# Patient Record
Sex: Male | Born: 1960 | Hispanic: Yes | Marital: Married | Smoking: Never smoker
Health system: Southern US, Community
[De-identification: ages and names within clinical notes are randomized; demographics above are authoritative.]

## PROBLEM LIST (undated history)

## (undated) DIAGNOSIS — F319 Bipolar disorder, unspecified: Secondary | ICD-10-CM

## (undated) DIAGNOSIS — I10 Essential (primary) hypertension: Secondary | ICD-10-CM

## (undated) DIAGNOSIS — E7800 Pure hypercholesterolemia, unspecified: Secondary | ICD-10-CM

## (undated) HISTORY — PX: HERNIA REPAIR: SHX51

---

## 2016-12-18 ENCOUNTER — Emergency Department: Payer: BLUE CROSS/BLUE SHIELD

## 2016-12-18 ENCOUNTER — Emergency Department
Admission: EM | Admit: 2016-12-18 | Discharge: 2016-12-18 | Disposition: A | Discharge status: Home / Self Care | Payer: BLUE CROSS/BLUE SHIELD | Attending: Emergency Medicine | Admitting: Emergency Medicine

## 2016-12-18 ENCOUNTER — Encounter: Payer: Self-pay | Admitting: Emergency Medicine

## 2016-12-18 DIAGNOSIS — S76219A Strain of adductor muscle, fascia and tendon of unspecified thigh, initial encounter: Secondary | ICD-10-CM | POA: Diagnosis not present

## 2016-12-18 DIAGNOSIS — Y9289 Other specified places as the place of occurrence of the external cause: Secondary | ICD-10-CM | POA: Insufficient documentation

## 2016-12-18 DIAGNOSIS — Y990 Civilian activity done for income or pay: Secondary | ICD-10-CM | POA: Insufficient documentation

## 2016-12-18 DIAGNOSIS — S76212A Strain of adductor muscle, fascia and tendon of left thigh, initial encounter: Secondary | ICD-10-CM

## 2016-12-18 DIAGNOSIS — W010XXA Fall on same level from slipping, tripping and stumbling without subsequent striking against object, initial encounter: Secondary | ICD-10-CM | POA: Diagnosis not present

## 2016-12-18 DIAGNOSIS — Y9301 Activity, walking, marching and hiking: Secondary | ICD-10-CM | POA: Insufficient documentation

## 2016-12-18 DIAGNOSIS — S3994XA Unspecified injury of external genitals, initial encounter: Secondary | ICD-10-CM | POA: Diagnosis present

## 2016-12-18 DIAGNOSIS — N50812 Left testicular pain: Secondary | ICD-10-CM

## 2016-12-18 MED ORDER — HYDROCODONE-ACETAMINOPHEN 5-325 MG PO TABS
2.0000 | ORAL_TABLET | Freq: Once | ORAL | Status: AC
  Administered 2016-12-18: 2 via ORAL
  Filled 2016-12-18: qty 2

## 2016-12-18 MED ORDER — HYDROCODONE-ACETAMINOPHEN 5-325 MG PO TABS: 1.0000 | ORAL_TABLET | Freq: Four times a day (QID) | ORAL | 0 refills | Status: DC | PRN

## 2016-12-18 MED ORDER — IBUPROFEN 400 MG PO TABS
ORAL_TABLET | ORAL | Status: AC
  Filled 2016-12-18: qty 1

## 2016-12-18 MED ORDER — IBUPROFEN 600 MG PO TABS: 600.0000 mg | ORAL_TABLET | Freq: Three times a day (TID) | ORAL | 0 refills | Status: DC | PRN

## 2016-12-18 MED ORDER — IBUPROFEN 400 MG PO TABS
600.0000 mg | ORAL_TABLET | Freq: Once | ORAL | Status: AC
Start: 2016-12-18 — End: 2016-12-18
  Administered 2016-12-18: 600 mg via ORAL

## 2016-12-18 NOTE — ED Triage Notes (Signed)
Pt to triage via WC, report slipped and fell at work, almost doing the splits, report pain to left testicle.  Pt reports area swollen as well and very tender to touch.  Pt in NAD at this time.   EDT attempt to contact supervisor and unable to reach.  Per WC profile, UDS only upon request.  Pt states he does not have alternative number at this time.

## 2016-12-18 NOTE — ED Notes (Signed)
Pt w/c profile states urine drug screening & Breath analysis "Only upon request" this tech attempts to contact Simeon CraftPaul Hall whose name is on the w/c profile but was unable to get anyone on the phone. This tech did not leave a message due to HIPPA violations. W/c not completed.

## 2016-12-18 NOTE — ED Provider Notes (Signed)
[facility] Emergency Department Provider Note  ____________________________________________   First MD Initiated Contact with Patient 12/18/16 2018     (approximate)  I have reviewed the triage vital signs and the nursing notes.   HISTORY  Chief Complaint Testicle Pain   HPI Andrew Mcclure is a 56 y.o. male who self presents to the emergency department with left groin pain that began suddenly at work. He was walking along when he slipped and did the splits and noted immediate severe searing tearing pain under his left testicle and along his left groin. It is worse when ambulating and improved with rest. He has no penile pain. No discharge. No fevers or chills. No abdominal pain nausea or vomiting.   History reviewed. No pertinent past medical history.  There are no active problems to display for this patient.   History reviewed. No pertinent surgical history.  Prior to Admission medications   Medication Sig Start Date End Date Taking? Authorizing Provider  HYDROcodone-acetaminophen (NORCO) 5-325 MG tablet Take 1 tablet by mouth every 6 (six) hours as needed for severe pain. 12/18/16   Merrily Brittle, MD  ibuprofen (ADVIL,MOTRIN) 600 MG tablet Take 1 tablet (600 mg total) by mouth every 8 (eight) hours as needed. 12/18/16   Merrily Brittle, MD    Allergies Patient has no known allergies.  History reviewed. No pertinent family history.  Social History Social History  Substance Use Topics  . Smoking status: Never Smoker  . Smokeless tobacco: Never Used  . Alcohol use No    Review of Systems Constitutional: No fever/chills ENT: No sore throat. Cardiovascular: Denies chest pain. Respiratory: Denies shortness of breath. Gastrointestinal: No abdominal pain.  No nausea, no vomiting.  No diarrhea.  No constipation. Musculoskeletal: Negative for back pain. Neurological: Negative for  headaches   ____________________________________________   PHYSICAL EXAM:  VITAL SIGNS: ED Triage Vitals [12/18/16 1913]  Enc Vitals Group     BP (!) 129/95     Pulse Rate 61     Resp 18     Temp 98.3 F (36.8 C)     Temp Source Oral     SpO2 100 %     Weight 198 lb (89.8 kg)     Height 5\' 9"  (1.753 m)     Head Circumference      Peak Flow      Pain Score 7     Pain Loc      Pain Edu?      Excl. in GC?     Constitutional: Alert and oriented 4 well appearing nontoxic no diaphoresis Head: Atraumatic. Nose: No congestion/rhinnorhea. Mouth/Throat: No trismus Neck: No stridor.   Cardiovascular: Regular rate and rhythm no murmurs Respiratory: Normal respiratory effort.  No retractions. Gastrointestinal: Soft nontender Genitourinary exam: Normal phallus, somewhat swollen left side of his scrotum although with no testicular tenderness he is tender underneath his scrotum along the perineum along the left side Neurologic:  Normal speech and language. No gross focal neurologic deficits are appreciated.  Skin:  Skin is warm, dry and intact. No rash noted.    ____________________________________________  LABS (all labs ordered are listed, but only abnormal results are displayed)  Labs Reviewed - No data to display   __________________________________________  EKG   ____________________________________________  RADIOLOGY  Testicular ultrasound without acute disease ____________________________________________   PROCEDURES  Procedure(s) performed: no  Procedures  Critical Care performed: no  Observation: no ____________________________________________   INITIAL IMPRESSION / ASSESSMENT AND PLAN / ED  COURSE  Pertinent labs & imaging results that were available during my care of the patient were reviewed by me and considered in my medical decision making (see chart for details).  The patient has acute severe left groin pain after performing the splits  inadvertently. Fortunately his testicular ultrasound is negative today and shows no signs of testicular torsion or trauma. This most likely represents a groin strain of his abductor muscles. I've advised him to ice the region as well as to use NSAIDs with Norco for breakthrough pain. It is not required that he follow up with orthopedic surgeon but I will provide him with the information for orthopedic surgeon on call for follow-up in one week should he desire. Strict return precautions given.      ____________________________________________   FINAL CLINICAL IMPRESSION(S) / ED DIAGNOSES  Final diagnoses:  Groin strain, left, initial encounter      NEW MEDICATIONS STARTED DURING THIS VISIT:  Discharge Medication List as of 12/18/2016  8:57 PM    START taking these medications   Details  HYDROcodone-acetaminophen (NORCO) 5-325 MG tablet Take 1 tablet by mouth every 6 (six) hours as needed for severe pain., Starting Thu 12/18/2016, Print    ibuprofen (ADVIL,MOTRIN) 600 MG tablet Take 1 tablet (600 mg total) by mouth every 8 (eight) hours as needed., Starting Thu 12/18/2016, Print         Note:  This document was prepared using Dragon voice recognition software and may include unintentional dictation errors.      Merrily Brittleifenbark, Olsen Mccutchan, MD 12/19/16 2226

## 2016-12-18 NOTE — Discharge Instructions (Signed)
Fortunately today the ultrasound of your testicles was normal in no damage to testicles occurred. You did strain a muscle which is extremely painful. Expect for your discomfort to last a full 2 weeks or so. Please follow-up with Dr. Joice LoftsPoggi of orthopedic surgery in one week should her pain persist and if he would like a reevaluation. Otherwise take your pain medication as prescribed and follow up with your primary care physician as needed.  It was a pleasure to take care of you today, and thank you for coming to our emergency department.  If you have any questions or concerns before leaving please ask the nurse to grab me and I'm more than happy to go through your aftercare instructions again.  If you were prescribed any opioid pain medication today such as Norco, Vicodin, Percocet, morphine, hydrocodone, or oxycodone please make sure you do not drive when you are taking this medication as it can alter your ability to drive safely.  If you have any concerns once you are home that you are not improving or are in fact getting worse before you can make it to your follow-up appointment, please do not hesitate to call 911 and come back for further evaluation.  Merrily BrittleNeil Mikeisha Lemonds MD  No results found for this or any previous visit. [place] Scrotum  Result Date: 12/18/2016 CLINICAL DATA:  Status post fall yesterday with pain and swelling of the left testicle EXAM: SCROTAL ULTRASOUND DOPPLER ULTRASOUND OF THE TESTICLES TECHNIQUE: Complete ultrasound examination of the testicles, epididymis, and other scrotal structures was performed. Color and spectral Doppler ultrasound were also utilized to evaluate blood flow to the testicles. COMPARISON:  None. FINDINGS: Right testicle Measurements: 4.4 x 2.5 x 3.2 cm. No mass or microlithiasis visualized. Left testicle Measurements: 3.9 x 2.2 x 3 cm. No mass or microlithiasis visualized. Right epididymis:  Normal in size and appearance. Left epididymis:  Normal in size and  appearance. Hydrocele:  There are small bilateral hydroceles. Varicocele:  None visualized. Pulsed Doppler interrogation of both testes demonstrates normal low resistance arterial and venous waveforms bilaterally. IMPRESSION: Small bilateral hydroceles.  No evidence of testicular torsion. Electronically Signed   By: Sherian ReinWei-Chen  Lin M.D.   On: 12/18/2016 20:30   [place] Art/ven Flow Abd Pelv Doppler  Result Date: 12/18/2016 CLINICAL DATA:  Status post fall yesterday with pain and swelling of the left testicle EXAM: SCROTAL ULTRASOUND DOPPLER ULTRASOUND OF THE TESTICLES TECHNIQUE: Complete ultrasound examination of the testicles, epididymis, and other scrotal structures was performed. Color and spectral Doppler ultrasound were also utilized to evaluate blood flow to the testicles. COMPARISON:  None. FINDINGS: Right testicle Measurements: 4.4 x 2.5 x 3.2 cm. No mass or microlithiasis visualized. Left testicle Measurements: 3.9 x 2.2 x 3 cm. No mass or microlithiasis visualized. Right epididymis:  Normal in size and appearance. Left epididymis:  Normal in size and appearance. Hydrocele:  There are small bilateral hydroceles. Varicocele:  None visualized. Pulsed Doppler interrogation of both testes demonstrates normal low resistance arterial and venous waveforms bilaterally. IMPRESSION: Small bilateral hydroceles.  No evidence of testicular torsion. Electronically Signed   By: Sherian ReinWei-Chen  Lin M.D.   On: 12/18/2016 20:30

## 2016-12-18 NOTE — ED Notes (Signed)
Pt to u/s via w/c accomp by u/s tech 

## 2016-12-18 NOTE — ED Notes (Signed)
Will defer physical exam to the MD.

## 2019-01-24 IMAGING — US US SCROTUM
1 series · 14 of 25 positions shown · non-contrast
Comparison: None.

CLINICAL DATA: Status post fall yesterday with pain and swelling of
the left testicle

EXAM:
SCROTAL ULTRASOUND
DOPPLER ULTRASOUND OF THE TESTICLES
TECHNIQUE: Complete ultrasound examination of the testicles, epididymis, and
other scrotal structures was performed. Color and spectral Doppler
ultrasound were also utilized to evaluate blood flow to the
testicles.

[Series 1: us scrotum · 0.08mm/px · 14 of 64 slices shown]
[im 1/64]
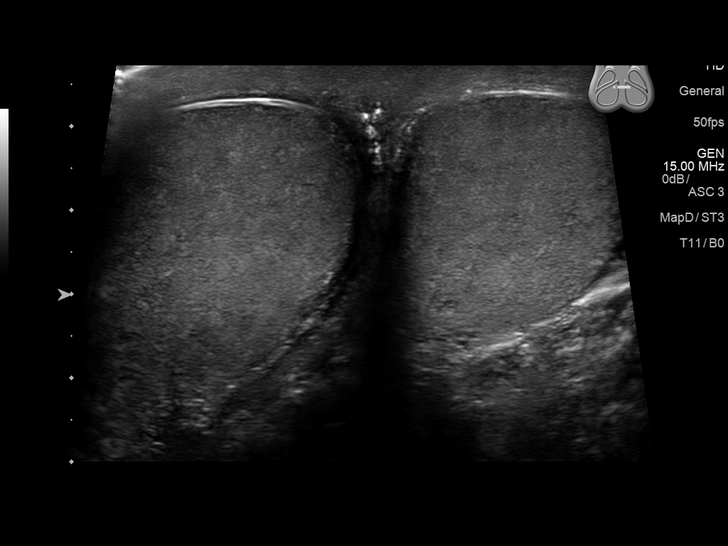
[im 6/64]
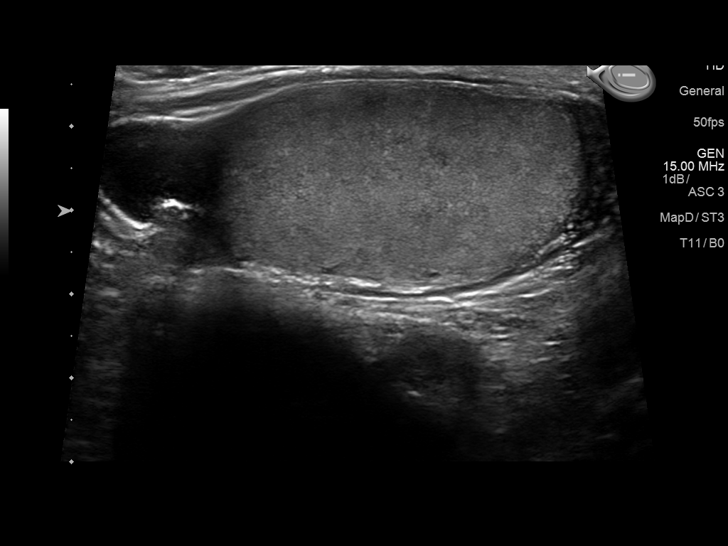
[im 11/64]
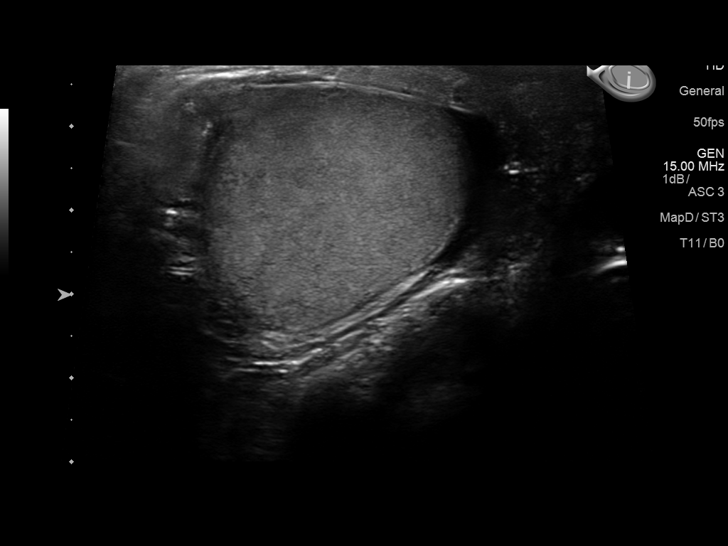
[im 16/64]
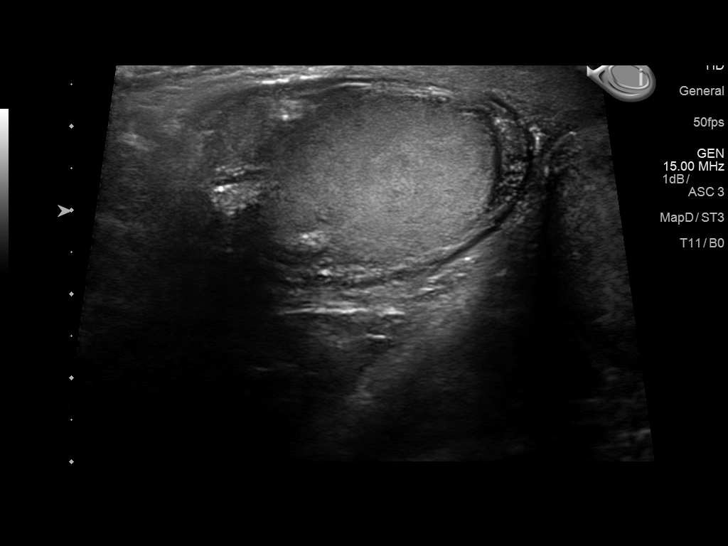
[im 22/64]
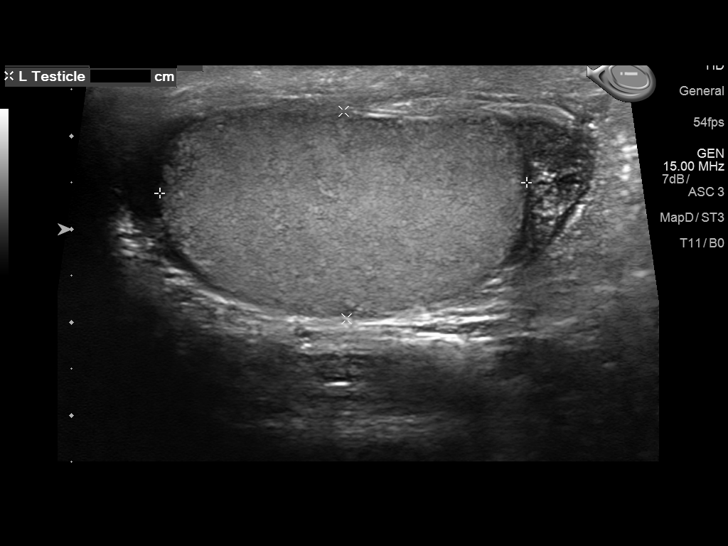
[im 24/64]
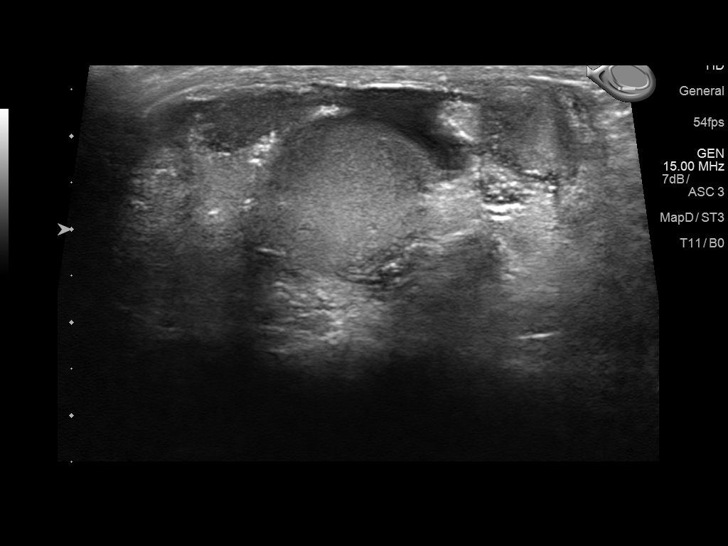
[im 29/64]
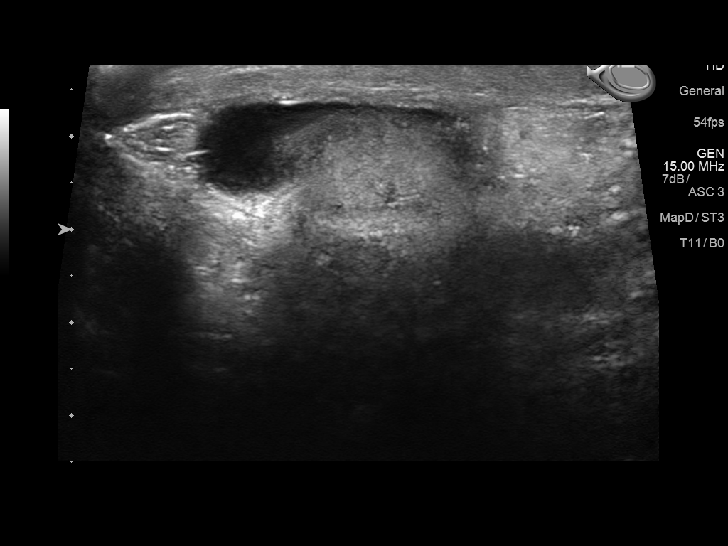
[im 35/64]
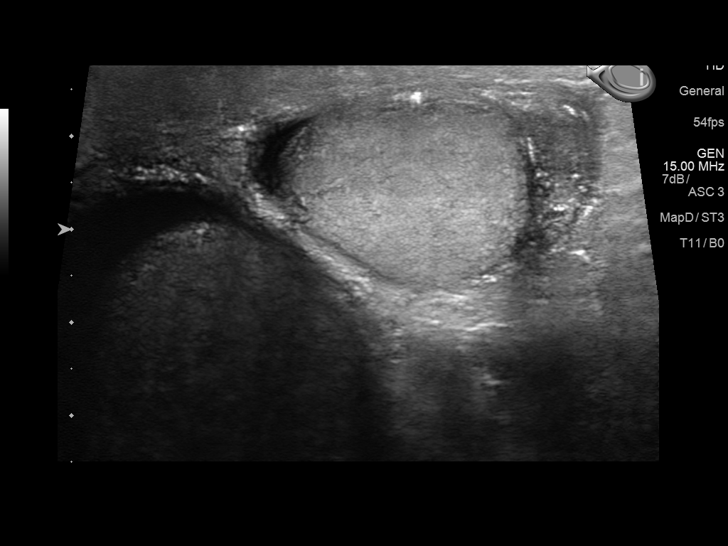
[im 40/64]
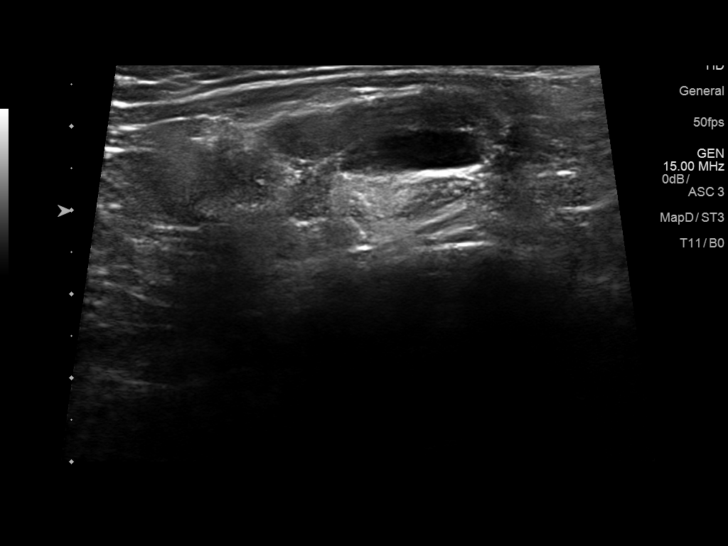
[im 43/64]
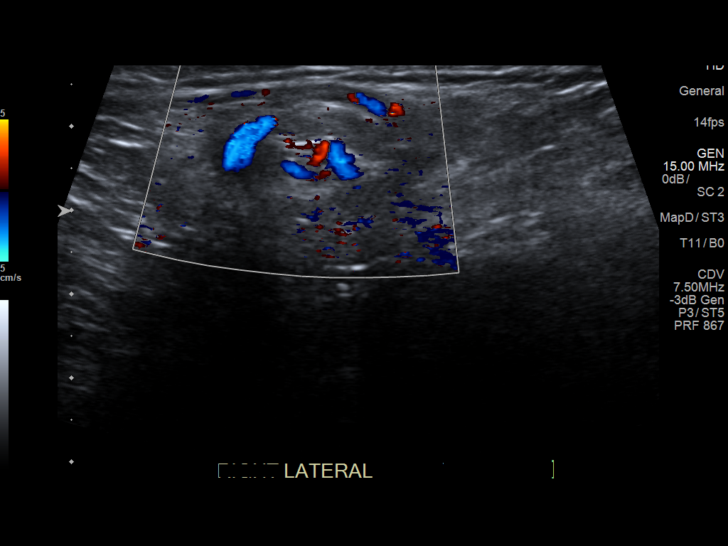
[im 48/64]
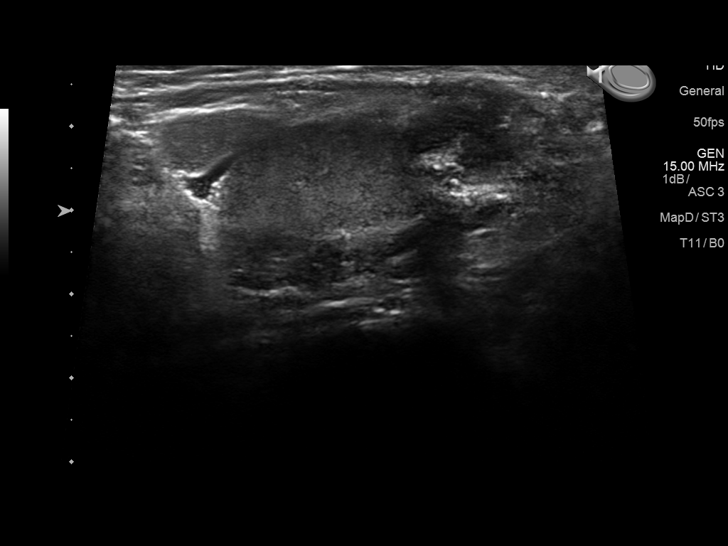
[im 53/64]
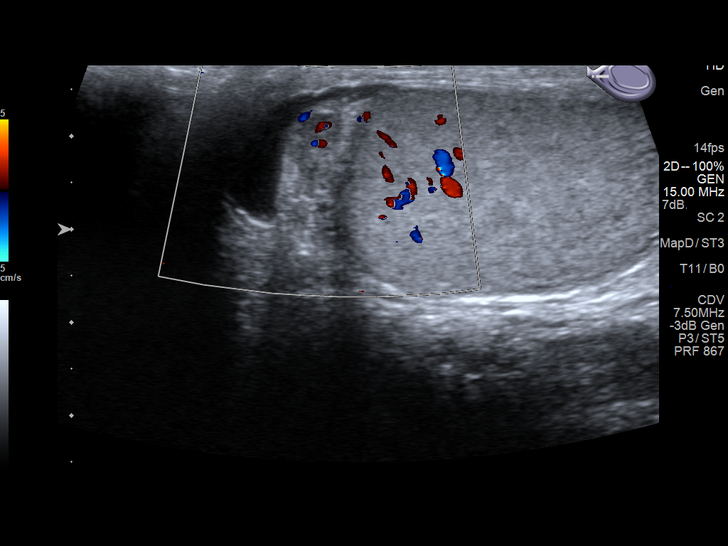
[im 58/64]
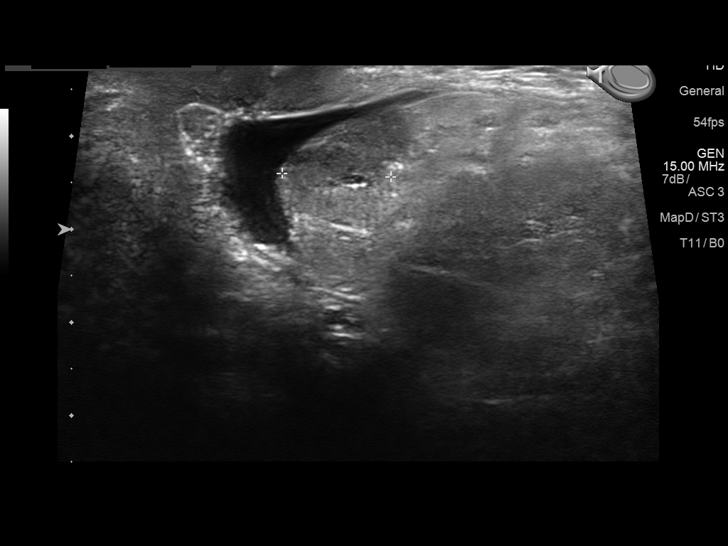
[im 64/64]
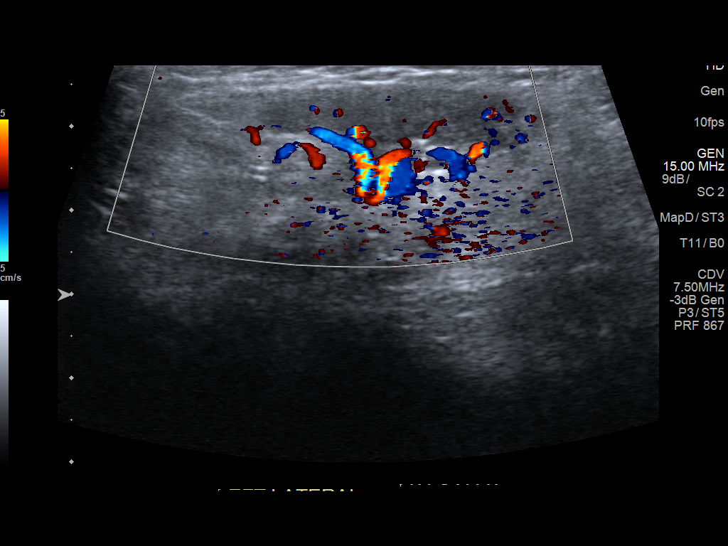

[14 of 25 positions shown; findings below may reference images not displayed]

FINDINGS: Right testicle

Measurements: 4.4 x 2.5 x 3.2 cm. No mass or microlithiasis
visualized.

Left testicle

Measurements: 3.9 x 2.2 x 3 cm. No mass or microlithiasis
visualized.

Right epididymis:  Normal in size and appearance.

Left epididymis:  Normal in size and appearance.

Hydrocele:  There are small bilateral hydroceles.

Varicocele:  None visualized.

Pulsed Doppler interrogation of both testes demonstrates normal low
resistance arterial and venous waveforms bilaterally.
IMPRESSION: Small bilateral hydroceles.  No evidence of testicular torsion.

## 2021-10-15 ENCOUNTER — Emergency Department: Payer: BLUE CROSS/BLUE SHIELD

## 2021-10-15 ENCOUNTER — Other Ambulatory Visit: Payer: Self-pay

## 2021-10-15 ENCOUNTER — Emergency Department
Admission: EM | Admit: 2021-10-15 | Discharge: 2021-10-15 | Disposition: A | Discharge status: Home / Self Care | Payer: BLUE CROSS/BLUE SHIELD | Attending: Emergency Medicine | Admitting: Emergency Medicine

## 2021-10-15 DIAGNOSIS — S60222A Contusion of left hand, initial encounter: Secondary | ICD-10-CM | POA: Insufficient documentation

## 2021-10-15 DIAGNOSIS — S6000XA Contusion of unspecified finger without damage to nail, initial encounter: Secondary | ICD-10-CM

## 2021-10-15 DIAGNOSIS — W230XXA Caught, crushed, jammed, or pinched between moving objects, initial encounter: Secondary | ICD-10-CM | POA: Insufficient documentation

## 2021-10-15 DIAGNOSIS — S6992XA Unspecified injury of left wrist, hand and finger(s), initial encounter: Secondary | ICD-10-CM | POA: Diagnosis present

## 2021-10-15 NOTE — ED Provider Notes (Signed)
? ?[facility] ?Provider Note ? ? ? Event Date/Time  ? First MD Initiated Contact with Patient 10/15/21 1516   ?  (approximate) ? ? ?History  ? ?Finger Injury ? ? ?HPI ? ?Andrew Mcclure is a 61 y.o. male presents emergency department complaining of left hand pain.  States his fingers got caught in the window while it was being rolled.  Patient complains of pain, no decreased range of motion, denies numbness or tingling.  Happened this morning ? ?  ? ? ?Physical Exam  ? ?Triage Vital Signs: ?ED Triage Vitals  ?Enc Vitals Group  ?   BP 10/15/21 1415 136/90  ?   Pulse Rate 10/15/21 1415 (!) 56  ?   Resp 10/15/21 1415 16  ?   Temp 10/15/21 1415 97.9 ?F (36.6 ?C)  ?   Temp Source 10/15/21 1415 Oral  ?   SpO2 10/15/21 1415 96 %  ?   Weight --   ?   Height --   ?   Head Circumference --   ?   Peak Flow --   ?   Pain Score 10/15/21 1356 6  ?   Pain Loc --   ?   Pain Edu? --   ?   Excl. in [place]? --   ? ? ?Most recent vital signs: ?Vitals:  ? 10/15/21 1415  ?BP: 136/90  ?Pulse: (!) 56  ?Resp: 16  ?Temp: 97.9 ?F (36.6 ?C)  ?SpO2: 96%  ? ? ? ?General: Awake, no distress.   ?CV:  Good peripheral perfusion. regular rate and  rhythm ?Resp:  Normal effort.  ?Abd:  No distention.   ?Other:  Full range of motion of all fingers, second third and fourth fingers tender to palpation, neurovascular intact ? ? ?ED Results / Procedures / Treatments  ? ?Labs ?(all labs ordered are listed, but only abnormal results are displayed) ?Labs Reviewed - No data to display ? ? ?EKG ? ? ? ? ?RADIOLOGY ?X-ray of the left hand ? ? ? ?PROCEDURES: ? ? ?Procedures ? ? ?MEDICATIONS ORDERED IN ED: ?Medications - No data to display ? ? ?IMPRESSION / MDM / ASSESSMENT AND PLAN / ED COURSE  ?I reviewed the triage vital signs and the nursing notes. ?             ?               ? ?Differential diagnosis includes, but is not limited to, contusion, fracture, laceration ? ?Patient's skin is intact he does not have a laceration. ? ?X-ray left hand  was independently reviewed by me and does not have any fractures.  Confirmed by radiology ? ?Explained the findings to the patient.  He is placed in buddy tape.  He is to wear this for 3 days.  Follow-up with his regular doctor if not improving in 3 to 4 days or he may follow-up with orthopedics.  He is in agreement treatment plan.  Discharged stable condition. ? ? ? ? ?  ? ? ?FINAL CLINICAL IMPRESSION(S) / ED DIAGNOSES  ? ?Final diagnoses:  ?Contusion of finger of left hand, unspecified finger, initial encounter  ? ? ? ?Rx / DC Orders  ? ?ED Discharge Orders   ? ? None  ? ?  ? ? ? ?Note:  This document was prepared using Dragon voice recognition software and may include unintentional dictation errors. ? ?  ?Versie Starks, PA-C ?10/15/21 1538 ? ?  ?Harvest Dark, MD ?10/15/21  2024 ? ?

## 2021-10-15 NOTE — ED Triage Notes (Signed)
Pt states he got his left 3rd and 4th finger rolled up in the car window today. ?

## 2021-10-15 NOTE — Discharge Instructions (Signed)
Wear the buddy tape for 3 days ?Tylenol or ibuprofen for pain as needed ?

## 2022-05-16 ENCOUNTER — Encounter: Payer: Self-pay | Admitting: Emergency Medicine

## 2022-05-16 ENCOUNTER — Other Ambulatory Visit: Payer: Self-pay

## 2022-05-16 ENCOUNTER — Emergency Department
Admission: EM | Admit: 2022-05-16 | Discharge: 2022-05-16 | Disposition: A | Discharge status: Home / Self Care | Payer: No Typology Code available for payment source | Attending: Student in an Organized Health Care Education/Training Program | Admitting: Student in an Organized Health Care Education/Training Program

## 2022-05-16 ENCOUNTER — Emergency Department: Payer: No Typology Code available for payment source

## 2022-05-16 DIAGNOSIS — U071 COVID-19: Secondary | ICD-10-CM

## 2022-05-16 DIAGNOSIS — R110 Nausea: Secondary | ICD-10-CM

## 2022-05-16 HISTORY — DX: Bipolar disorder, unspecified: F31.9

## 2022-05-16 LAB — CBC WITH DIFFERENTIAL/PLATELET
Abs Immature Granulocytes: 0.01 10*3/uL (ref 0.00–0.07)
Basophils Absolute: 0 10*3/uL (ref 0.0–0.1)
Basophils Relative: 0 %
Eosinophils Absolute: 0 10*3/uL (ref 0.0–0.5)
Eosinophils Relative: 1 %
HCT: 49.5 % (ref 39.0–52.0)
Hemoglobin: 17 g/dL (ref 13.0–17.0)
Immature Granulocytes: 0 %
Lymphocytes Relative: 21 %
Lymphs Abs: 1.3 10*3/uL (ref 0.7–4.0)
MCH: 32.6 pg (ref 26.0–34.0)
MCHC: 34.3 g/dL (ref 30.0–36.0)
MCV: 95 fL (ref 80.0–100.0)
Monocytes Absolute: 0.5 10*3/uL (ref 0.1–1.0)
Monocytes Relative: 8 %
Neutro Abs: 4.6 10*3/uL (ref 1.7–7.7)
Neutrophils Relative %: 70 %
Platelets: 218 10*3/uL (ref 150–400)
RBC: 5.21 MIL/uL (ref 4.22–5.81)
RDW: 13.1 % (ref 11.5–15.5)
WBC: 6.5 10*3/uL (ref 4.0–10.5)
nRBC: 0 % (ref 0.0–0.2)

## 2022-05-16 LAB — COMPREHENSIVE METABOLIC PANEL
ALT: 13 U/L (ref 0–44)
AST: 16 U/L (ref 15–41)
Albumin: 3.8 g/dL (ref 3.5–5.0)
Alkaline Phosphatase: 83 U/L (ref 38–126)
Anion gap: 10 (ref 5–15)
BUN: 13 mg/dL (ref 6–20)
CO2: 20 mmol/L — ABNORMAL LOW (ref 22–32)
Calcium: 9.3 mg/dL (ref 8.9–10.3)
Chloride: 108 mmol/L (ref 98–111)
Creatinine, Ser: 0.96 mg/dL (ref 0.61–1.24)
GFR, Estimated: >60 mL/min (ref >60)
Glucose, Bld: 118 mg/dL — ABNORMAL HIGH (ref 70–99)
Potassium: 3.3 mmol/L — ABNORMAL LOW (ref 3.5–5.1)
Sodium: 138 mmol/L (ref 135–145)
Total Bilirubin: 0.7 mg/dL (ref 0.3–1.2)
Total Protein: 7.7 g/dL (ref 6.5–8.1)

## 2022-05-16 LAB — TROPONIN I (HIGH SENSITIVITY)
Troponin I (High Sensitivity): 4 ng/L (ref <18)
Troponin I (High Sensitivity): 3 ng/L (ref <18)

## 2022-05-16 MED ORDER — ONDANSETRON 4 MG PO TBDP: 4.0000 mg | ORAL_TABLET | Freq: Three times a day (TID) | ORAL | 0 refills | Status: DC | PRN

## 2022-05-16 MED ORDER — ALBUTEROL SULFATE (2.5 MG/3ML) 0.083% IN NEBU
3.0000 mL | INHALATION_SOLUTION | Freq: Once | RESPIRATORY_TRACT | Status: AC
  Administered 2022-05-16: 3 mL via RESPIRATORY_TRACT
  Filled 2022-05-16: qty 3

## 2022-05-16 MED ORDER — POTASSIUM CHLORIDE CRYS ER 20 MEQ PO TBCR
40.0000 meq | EXTENDED_RELEASE_TABLET | Freq: Once | ORAL | Status: AC
  Administered 2022-05-16: 40 meq via ORAL
  Filled 2022-05-16: qty 2

## 2022-05-16 MED ORDER — ACETAMINOPHEN 500 MG PO TABS
1000.0000 mg | ORAL_TABLET | Freq: Once | ORAL | Status: AC
  Administered 2022-05-16: 1000 mg via ORAL
  Filled 2022-05-16: qty 2

## 2022-05-16 MED ORDER — SODIUM CHLORIDE 0.9 % IV BOLUS
1000.0000 mL | Freq: Once | INTRAVENOUS | Status: AC
  Administered 2022-05-16: 1000 mL via INTRAVENOUS

## 2022-05-16 MED ORDER — ONDANSETRON HCL 4 MG/2ML IJ SOLN
4.0000 mg | Freq: Once | INTRAMUSCULAR | Status: AC
  Administered 2022-05-16: 4 mg via INTRAVENOUS
  Filled 2022-05-16: qty 2

## 2022-05-16 NOTE — ED Triage Notes (Signed)
Pt in via POV, reports being Covid+ on Tuesday, being prescribed Paxlovid which he has been taking but continues to feel worse.  Complaints of ongoing headache, shortness of breath w/ new onset of chest pain as walking to room.  A/Ox4, NAD noted at this time.

## 2022-05-16 NOTE — ED Notes (Signed)
Pt o2 sat 98% on room air while ambulating.

## 2022-05-16 NOTE — ED Provider Notes (Signed)
[facility] Provider Note    Event Date/Time   First MD Initiated Contact with Patient 05/16/22 [phone]     (approximate)   History   Covid Positive   HPI  Andrew Mcclure is a 61 y.o. male previously healthy with recent diagnosis of COVID presents to the ER for evaluation of generalized malaise does not feel like he is getting better after 3 days of Paxlovid.  He is having shortness of breath cough and some chest discomfort.  No pleuritic chest pain.  Does feel more winded than usual with ambulation.  He was fully vaccinated.  Has had a few episodes of nausea with vomiting no diarrhea.  Does not feel nauseated at this time no abdominal pain.     Physical Exam   Triage Vital Signs: ED Triage Vitals  Enc Vitals Group     BP 05/16/22 0938 (!) 172/97     Pulse Rate 05/16/22 0938 (!) 50     Resp 05/16/22 0938 17     Temp 05/16/22 0938 99.5 F (37.5 C)     Temp Source 05/16/22 0938 Oral     SpO2 05/16/22 0938 97 %     Weight 05/16/22 0939 169 lb (76.7 kg)     Height 05/16/22 0939 5\' 9"  (1.753 m)     Head Circumference --      Peak Flow --      Pain Score 05/16/22 0938 5     Pain Loc --      Pain Edu? --      Excl. in GC? --     Most recent vital signs: Vitals:   05/16/22 0938 05/16/22 1030  BP: (!) 172/97 (!) 142/89  Pulse: (!) 50 (!) 51  Resp: 17 16  Temp: 99.5 F (37.5 C)   SpO2: 97% 98%     Constitutional: Alert  Eyes: Conjunctivae are normal.  Head: Atraumatic. Nose: No congestion/rhinnorhea. Mouth/Throat: Mucous membranes are moist.   Neck: Painless ROM.  Cardiovascular:   Good peripheral circulation. Respiratory: Normal respiratory effort.  No retractions.  Gastrointestinal: Soft and nontender.  Musculoskeletal:  no deformity Neurologic:  MAE spontaneously. No gross focal neurologic deficits are appreciated.  Skin:  Skin is warm, dry and intact. No rash noted. Psychiatric: Mood and affect are normal. Speech and behavior are  normal.    ED Results / Procedures / Treatments   Labs (all labs ordered are listed, but only abnormal results are displayed) Labs Reviewed  COMPREHENSIVE METABOLIC PANEL - Abnormal; Notable for the following components:      Result Value   Potassium 3.3 (*)    CO2 20 (*)    Glucose, Bld 118 (*)    All other components within normal limits  CBC WITH DIFFERENTIAL/PLATELET  TROPONIN I (HIGH SENSITIVITY)  TROPONIN I (HIGH SENSITIVITY)     EKG  ED ECG REPORT I, 05/18/22, the attending physician, personally viewed and interpreted this ECG.   Date: 05/16/2022  EKG Time: 10:00  Rate: 50  Rhythm: sinus  Axis: normal  Intervals: normal  ST&T Change: no stemi, no depressions    RADIOLOGY Please see ED Course for my review and interpretation.  I personally reviewed all radiographic images ordered to evaluate for the above acute complaints and reviewed radiology reports and findings.  These findings were personally discussed with the patient.  Please see medical record for radiology report.    PROCEDURES:  Critical Care performed: No  Procedures   MEDICATIONS ORDERED IN  ED: Medications  ondansetron (ZOFRAN) injection 4 mg (has no administration in time range)  acetaminophen (TYLENOL) tablet 1,000 mg (has no administration in time range)  sodium chloride 0.9 % bolus 1,000 mL (0 mLs Intravenous Stopped 05/16/22 1151)  albuterol (PROVENTIL) (2.5 MG/3ML) 0.083% nebulizer solution 3 mL (3 mLs Nebulization Given 05/16/22 1050)  potassium chloride SA (KLOR-CON M) CR tablet 40 mEq (40 mEq Oral Given 05/16/22 1050)     IMPRESSION / MDM / ASSESSMENT AND PLAN / ED COURSE  I reviewed the triage vital signs and the nursing notes.                              Differential diagnosis includes, but is not limited to, covid, pna, ptx, pe, chf, dehydration, medication effect  Patient presenting to the ER for evaluation of symptoms as described above.  Based on symptoms,  risk factors and considered above differential, this presenting complaint could reflect a potentially life-threatening illness therefore the patient will be placed on continuous pulse oximetry and telemetry for monitoring.  Laboratory evaluation will be sent to evaluate for the above complaints.      Clinical Course as of 05/16/22 1305  Fri May 16, 2022  1015 Chest x-ray on my review and interpretation does not show any evidence of consolidation or pneumothorax. [PR]  1302 Patient's blood work is reassuring.  Symptoms are consistent with COVID-19.  Have low suspicion for PE bacterial pneumonia CHF or other acute abnormality.  Patient tolerating p.o. is now complaining of some mild nausea but not vomiting.  This point do believe he stable and appropriate for outpatient follow-up.  Discussed strict return precautions. [PR]    Clinical Course User Index [PR] Willy Eddy, MD   FINAL CLINICAL IMPRESSION(S) / ED DIAGNOSES   Final diagnoses:  COVID-19  Nausea     Rx / DC Orders   ED Discharge Orders          Ordered    ondansetron (ZOFRAN-ODT) 4 MG disintegrating tablet  Every 8 hours PRN,   Status:  Discontinued        05/16/22 1302    ondansetron (ZOFRAN-ODT) 4 MG disintegrating tablet  Every 8 hours PRN        05/16/22 1302             Note:  This document was prepared using Dragon voice recognition software and may include unintentional dictation errors.    Willy Eddy, MD 05/16/22 [phone]

## 2022-05-17 ENCOUNTER — Emergency Department: Payer: No Typology Code available for payment source

## 2022-05-17 ENCOUNTER — Observation Stay
Admission: EM | Admit: 2022-05-17 | Discharge: 2022-05-19 | Disposition: A | Discharge status: Home / Self Care | Payer: No Typology Code available for payment source | Attending: Internal Medicine | Admitting: Internal Medicine

## 2022-05-17 ENCOUNTER — Other Ambulatory Visit: Payer: Self-pay

## 2022-05-17 DIAGNOSIS — U071 COVID-19: Secondary | ICD-10-CM | POA: Diagnosis not present

## 2022-05-17 DIAGNOSIS — R42 Dizziness and giddiness: Secondary | ICD-10-CM | POA: Diagnosis not present

## 2022-05-17 DIAGNOSIS — E876 Hypokalemia: Secondary | ICD-10-CM

## 2022-05-17 DIAGNOSIS — E860 Dehydration: Secondary | ICD-10-CM | POA: Diagnosis not present

## 2022-05-17 DIAGNOSIS — R9431 Abnormal electrocardiogram [ECG] [EKG]: Secondary | ICD-10-CM | POA: Diagnosis present

## 2022-05-17 DIAGNOSIS — R630 Anorexia: Secondary | ICD-10-CM | POA: Insufficient documentation

## 2022-05-17 DIAGNOSIS — R112 Nausea with vomiting, unspecified: Secondary | ICD-10-CM | POA: Diagnosis present

## 2022-05-17 DIAGNOSIS — F319 Bipolar disorder, unspecified: Secondary | ICD-10-CM | POA: Diagnosis present

## 2022-05-17 DIAGNOSIS — N400 Enlarged prostate without lower urinary tract symptoms: Secondary | ICD-10-CM | POA: Diagnosis present

## 2022-05-17 DIAGNOSIS — Z79899 Other long term (current) drug therapy: Secondary | ICD-10-CM | POA: Diagnosis not present

## 2022-05-17 DIAGNOSIS — R110 Nausea: Secondary | ICD-10-CM

## 2022-05-17 HISTORY — DX: Nausea with vomiting, unspecified: R11.2

## 2022-05-17 HISTORY — DX: Bipolar disorder, unspecified: F31.9

## 2022-05-17 LAB — CBC
HCT: 49.3 % (ref 39.0–52.0)
Hemoglobin: 16.5 g/dL (ref 13.0–17.0)
MCH: 32 pg (ref 26.0–34.0)
MCHC: 33.5 g/dL (ref 30.0–36.0)
MCV: 95.7 fL (ref 80.0–100.0)
Platelets: 228 10*3/uL (ref 150–400)
RBC: 5.15 MIL/uL (ref 4.22–5.81)
RDW: 13 % (ref 11.5–15.5)
WBC: 6.1 10*3/uL (ref 4.0–10.5)
nRBC: 0 % (ref 0.0–0.2)

## 2022-05-17 LAB — URINALYSIS, ROUTINE W REFLEX MICROSCOPIC
Bilirubin Urine: NEGATIVE
Glucose, UA: NEGATIVE mg/dL
Hgb urine dipstick: NEGATIVE
Ketones, ur: 5 mg/dL — AB
Leukocytes,Ua: NEGATIVE
Nitrite: NEGATIVE
Protein, ur: NEGATIVE mg/dL
Specific Gravity, Urine: 1.01 (ref 1.005–1.030)
pH: 7 (ref 5.0–8.0)

## 2022-05-17 LAB — BASIC METABOLIC PANEL
Anion gap: 9 (ref 5–15)
BUN: 13 mg/dL (ref 6–20)
CO2: 22 mmol/L (ref 22–32)
Calcium: 9.7 mg/dL (ref 8.9–10.3)
Chloride: 111 mmol/L (ref 98–111)
Creatinine, Ser: 0.98 mg/dL (ref 0.61–1.24)
GFR, Estimated: >60 mL/min (ref >60)
Glucose, Bld: 125 mg/dL — ABNORMAL HIGH (ref 70–99)
Potassium: 3.9 mmol/L (ref 3.5–5.1)
Sodium: 142 mmol/L (ref 135–145)

## 2022-05-17 LAB — C-REACTIVE PROTEIN: CRP: <0.5 mg/dL (ref <1.0)

## 2022-05-17 MED ORDER — PROMETHAZINE HCL 25 MG/ML IJ SOLN
12.5000 mg | Freq: Four times a day (QID) | INTRAMUSCULAR | Status: DC | PRN
  Administered 2022-05-17: 12.5 mg via INTRAMUSCULAR
  Filled 2022-05-17 (×2): qty 1

## 2022-05-17 MED ORDER — SALINE SPRAY 0.65 % NA SOLN
1.0000 | Freq: Once | NASAL | Status: AC
  Administered 2022-05-17: 1 via NASAL
  Filled 2022-05-17: qty 44

## 2022-05-17 MED ORDER — ENOXAPARIN SODIUM 40 MG/0.4ML IJ SOSY
40.0000 mg | PREFILLED_SYRINGE | INTRAMUSCULAR | Status: DC
  Administered 2022-05-17 – 2022-05-18 (×2): 40 mg via SUBCUTANEOUS
  Filled 2022-05-17 (×2): qty 0.4

## 2022-05-17 MED ORDER — ONDANSETRON HCL 4 MG/2ML IJ SOLN
4.0000 mg | INTRAMUSCULAR | Status: AC
  Administered 2022-05-17: 4 mg via INTRAVENOUS
  Filled 2022-05-17: qty 2

## 2022-05-17 MED ORDER — ALBUTEROL SULFATE (2.5 MG/3ML) 0.083% IN NEBU: 2.5000 mg | INHALATION_SOLUTION | RESPIRATORY_TRACT | Status: DC | PRN

## 2022-05-17 MED ORDER — SODIUM CHLORIDE 0.9 % IV BOLUS
1000.0000 mL | Freq: Once | INTRAVENOUS | Status: AC
  Administered 2022-05-17: 1000 mL via INTRAVENOUS

## 2022-05-17 MED ORDER — BUPROPION HCL ER (XL) 300 MG PO TB24
300.0000 mg | ORAL_TABLET | Freq: Every day | ORAL | Status: DC
  Administered 2022-05-18 – 2022-05-19 (×2): 300 mg via ORAL
  Filled 2022-05-17 (×2): qty 1

## 2022-05-17 MED ORDER — ACETAMINOPHEN 325 MG PO TABS
650.0000 mg | ORAL_TABLET | Freq: Four times a day (QID) | ORAL | Status: DC | PRN
  Administered 2022-05-18 (×2): 650 mg via ORAL
  Filled 2022-05-17 (×2): qty 2

## 2022-05-17 MED ORDER — SODIUM CHLORIDE 0.9 % IV SOLN: INTRAVENOUS | Status: DC

## 2022-05-17 MED ORDER — MOLNUPIRAVIR EUA 200MG CAPSULE
4.0000 | ORAL_CAPSULE | Freq: Two times a day (BID) | ORAL | Status: DC
  Administered 2022-05-17 – 2022-05-18 (×2): 800 mg via ORAL
  Filled 2022-05-17 (×2): qty 4

## 2022-05-17 MED ORDER — LITHIUM CARBONATE ER 300 MG PO TBCR
300.0000 mg | EXTENDED_RELEASE_TABLET | Freq: Every day | ORAL | Status: DC
  Administered 2022-05-17 – 2022-05-18 (×2): 300 mg via ORAL
  Filled 2022-05-17 (×3): qty 1

## 2022-05-17 MED ORDER — DM-GUAIFENESIN ER 30-600 MG PO TB12: 1.0000 | ORAL_TABLET | Freq: Two times a day (BID) | ORAL | Status: DC | PRN

## 2022-05-17 MED ORDER — ONDANSETRON HCL 4 MG/2ML IJ SOLN: 4.0000 mg | Freq: Three times a day (TID) | INTRAMUSCULAR | Status: DC | PRN

## 2022-05-17 MED ORDER — TAMSULOSIN HCL 0.4 MG PO CAPS
0.4000 mg | ORAL_CAPSULE | Freq: Every day | ORAL | Status: DC
  Administered 2022-05-17 – 2022-05-19 (×3): 0.4 mg via ORAL
  Filled 2022-05-17 (×3): qty 1

## 2022-05-17 MED ORDER — LITHIUM CARBONATE ER 450 MG PO TBCR
450.0000 mg | EXTENDED_RELEASE_TABLET | Freq: Two times a day (BID) | ORAL | Status: DC
  Administered 2022-05-18 – 2022-05-19 (×3): 450 mg via ORAL
  Filled 2022-05-17 (×3): qty 1

## 2022-05-17 MED ORDER — IOHEXOL 300 MG/ML  SOLN
100.0000 mL | Freq: Once | INTRAMUSCULAR | Status: AC | PRN
  Administered 2022-05-17: 100 mL via INTRAVENOUS

## 2022-05-17 MED ORDER — DIPHENHYDRAMINE HCL 50 MG/ML IJ SOLN
12.5000 mg | Freq: Four times a day (QID) | INTRAMUSCULAR | Status: DC | PRN
  Administered 2022-05-18: 12.5 mg via INTRAVENOUS
  Filled 2022-05-17: qty 1

## 2022-05-17 NOTE — ED Triage Notes (Signed)
Pt presents via POV c/o emesis. Reports seen yesterday and d/c home however still unable to keep anything down. Denies abd pain. Reports has been sick all week.

## 2022-05-17 NOTE — ED Provider Notes (Signed)
[facility] Provider Note    Event Date/Time   First MD Initiated Contact with Patient 05/17/22 1228     (approximate)   History   Emesis   HPI  Andrew Mcclure is a 61 y.o. male who is diagnosed with coronavirus about 1 week ago.  He completed 3 days of Paxlovid and discontinued due to upset stomach.  He was seen in the ER yesterday as he continued to feel fatigued weak and tired.  He reports after being seen in the ER he left got in his car and then began dry heaving and vomiting.  He has had no appetite and has had some dry heaves and vomiting for the last couple of days.  Now he is to the point where he tries to stand up or sit up he will feel lightheaded, almost as though is going to pass out but has not.  He is not having any more runny nose he has a very minor cough and he is having no difficulty breathing but feels like he just has no appetite and cannot keep food on his stomach.  Anything he has attempted to eat including water hits his stomach and induces dry heaves.  No ongoing fevers.  No weakness other than just feeling generally weak and tired.  No headache.  No pain or burning with urination.  No rashes.  Reports his stomach does not really hurt this just like he has no appetite, feels somewhat nauseated and anything he eats will vomit     Physical Exam   Triage Vital Signs: ED Triage Vitals  Enc Vitals Group     BP 05/17/22 1007 (!) 164/92     Pulse Rate 05/17/22 1007 (!) 50     Resp 05/17/22 1007 18     Temp 05/17/22 1007 (!) 96.7 F (35.9 C)     Temp src --      SpO2 05/17/22 1007 98 %     Weight --      Height --      Head Circumference --      Peak Flow --      Pain Score 05/17/22 1011 0     Pain Loc --      Pain Edu? --      Excl. in GC? --     Most recent vital signs: Vitals:   05/17/22 1007 05/17/22 1501  BP: (!) 164/92 (!) 150/83  Pulse: (!) 50 60  Resp: 18 18  Temp: (!) 96.7 F (35.9 C) 97.6 F (36.4 C)  SpO2: 98%  97%     General: Awake, no distress.  Does however appear mildly ill and fatigued.  May exhibits no distress and is fully alert well oriented pleasant.  Reports just getting from wheelchair to the bed he felt lightheaded as though he is going to pass out. CV:  Good peripheral perfusion.  Normal heart tones and rate. Dukas membranes are quite dry Resp:  Normal effort.  Clear bilaterally Abd:  No distention.  Soft reports mild discomfort but not really pain to palpation across all areas of the abdomen.  There is no hernias or palpable masses.  No peritonitis. Other:  Warm well-perfused extremities.  Skin turgor is slightly slow   ED Results / Procedures / Treatments   Labs (all labs ordered are listed, but only abnormal results are displayed) Labs Reviewed  BASIC METABOLIC PANEL - Abnormal; Notable for the following components:      Result Value  Glucose, Bld 125 (*)    All other components within normal limits  URINALYSIS, ROUTINE W REFLEX MICROSCOPIC - Abnormal; Notable for the following components:   Color, Urine YELLOW (*)    APPearance CLEAR (*)    Ketones, ur 5 (*)    All other components within normal limits  CBC      RADIOLOGY  Abdominal plain film interpreted by me as negative for acute obstruction, though there is a lack of gas over the mid abdomen  CT imaging ordered after also reviewing the radiologist report that denotes a paucity of gas.  Wish to exclude underlying obstructive pathology or ileus   CT ABDOMEN PELVIS W CONTRAST  Result Date: 05/17/2022 CLINICAL DATA:  Vomiting and abdominal pain for approximately 1 week. EXAM: CT ABDOMEN AND PELVIS WITH CONTRAST TECHNIQUE: Multidetector CT imaging of the abdomen and pelvis was performed using the standard protocol following bolus administration of intravenous contrast. RADIATION DOSE REDUCTION: This exam was performed according to the departmental dose-optimization program which includes automated exposure control,  adjustment of the mA and/or kV according to patient size and/or use of iterative reconstruction technique. CONTRAST:  OMNIPAQUE IOHEXOL 300 MG/ML  SOLN COMPARISON:  None Available. FINDINGS: Lower Chest: No acute findings. Hepatobiliary: No hepatic masses identified. Gallbladder is unremarkable. No evidence of biliary ductal dilatation. Pancreas:  No mass or inflammatory changes. Spleen: Within normal limits in size and appearance. Adrenals/Urinary Tract: No suspicious masses identified. Mild right renal parenchymal scarring noted. No evidence of ureteral calculi or hydronephrosis. Distended urinary bladder is seen showing multiple bladder diverticula, which could be due to chronic bladder outlet obstruction or neurogenic bladder. Stomach/Bowel: Small hiatal hernia noted. No evidence of obstruction, inflammatory process or abnormal fluid collections. Normal appendix visualized. Vascular/Lymphatic: No pathologically enlarged lymph nodes. No acute vascular findings. Aortic atherosclerotic calcification incidentally noted. Reproductive: Normal size prostate. No mass or other significant abnormality. Other: Small to moderate bilateral inguinal hernias are seen, which contain only fat. A tiny midline epigastric ventral hernia is seen, which contains only fat. Musculoskeletal:  No suspicious bone lesions identified. IMPRESSION: No acute findings within the abdomen or pelvis. Distended urinary bladder with multiple diverticula, which could be due to chronic bladder outlet obstruction or neurogenic bladder. Small hiatal hernia. Small to moderate bilateral inguinal hernias, and tiny epigastric ventral hernia, all containing only fat. Aortic Atherosclerosis (ICD10-I70.0). Electronically Signed   By: Danae Orleans M.D.   On: 05/17/2022 15:46     PROCEDURES:  Critical Care performed: No  Procedures   MEDICATIONS ORDERED IN ED: Medications  promethazine (PHENERGAN) injection 12.5 mg (12.5 mg Intramuscular Given  05/17/22 1503)  ondansetron (ZOFRAN) injection 4 mg (has no administration in time range)  sodium chloride (OCEAN) 0.65 % nasal spray 1 spray (1 spray Each Nare Given 05/17/22 1318)  sodium chloride 0.9 % bolus 1,000 mL (0 mLs Intravenous Stopped 05/17/22 1458)  ondansetron (ZOFRAN) injection 4 mg (4 mg Intravenous Given 05/17/22 1313)  sodium chloride 0.9 % bolus 1,000 mL (1,000 mLs Intravenous New Bag/Given 05/17/22 1503)  iohexol (OMNIPAQUE) 300 MG/ML solution 100 mL (100 mLs Intravenous Contrast Given 05/17/22 1510)     IMPRESSION / MDM / ASSESSMENT AND PLAN / ED COURSE  I reviewed the triage vital signs and the nursing notes.                              Differential diagnosis includes, but is not limited to, coronavirus  or potential side effect from Paxlovid.  He seems to be improving with regard to pulmonary symptoms and generalized symptoms of fevers chills fatigue, but is continued to feel nauseated and now developing anorexia.  He does not appear acutely unstable, but does appear dehydrated by clinical exam and I suspect also having orthostatic symptoms as when he sits up or moves about he feels lightheaded as though he is to pass out.  He appears dehydrated.  Labs reviewed metabolic panel is normal along with CBC.  Abdominal plain film concerning for a paucity of gas, have thus ordered CT to further evaluate exclude obstructive pathologies ileus etc.  ----------------------------------------- 2:22 PM on 05/17/2022 ----------------------------------------- Patient resting at this time but reports still unable to eat anything due to lack of appetite and mild ongoing nausea.  He would like to trial intramuscular Phenergan which has been ordered, and I have also ordered an additional liter of fluid.  Patient's presentation is most consistent with acute complicated illness / injury requiring diagnostic workup.  The patient is on the cardiac monitor to evaluate for evidence of  arrhythmia and/or significant heart rate changes.  ----------------------------------------- 4:20 PM on 05/17/2022 ----------------------------------------- Despite having received 2 L of normal saline, patient continues to feel somewhat nauseated but when he tries to sit up still becomes lightheaded.  He feels significantly orthostatic with even just sitting up.  Have not received this amount of fluid, the fact that he has been to the ER yesterday, and he continues to be symptomatic I have recommended he be admitted for further care treatment and evaluation under the hospitalist service.  Patient is understanding agreeable with this plan.  Suspect he is likely having ongoing effects related to COVID-19 possible associated gastroenteritis, anorexia, and intractable nausea with orthostatic symptoms.  Clinical case consulted with our hospitalist Dr. Clyde Lundborg Who is agreeable with admission      FINAL CLINICAL IMPRESSION(S) / ED DIAGNOSES   Final diagnoses:  Anorexia  Nausea  Orthostatic dizziness  COVID-19     Rx / DC Orders   ED Discharge Orders     None        Note:  This document was prepared using Dragon voice recognition software and may include unintentional dictation errors.   Sharyn Creamer, MD 05/17/22 [phone]

## 2022-05-17 NOTE — ED Notes (Signed)
Request made for transport to the floor ?

## 2022-05-17 NOTE — ED Notes (Signed)
Pt reports N/V continues after DC yesterday.

## 2022-05-17 NOTE — ED Notes (Signed)
Transportation requested  

## 2022-05-17 NOTE — H&P (Signed)
History and Physical    Andrew Mcclure [id number] DOB: 03-Jun-1961 DOA: 05/17/2022  Referring MD/NP/PA:   PCP: Ivor Messier, MD   Patient coming from:  The patient is coming from home.  At baseline, pt is independent for most of ADL.        Chief Complaint: Nausea, vomiting, lightheadedness  HPI: Andrew Mcclure is a 61 y.o. male with medical history significant of bipolar disorder, recently diagnosed with with COVID infection on 11/14, who presents with nausea vomiting and lightheadedness.  Patient states that he has been sick for more than 1 week.  Patient was tested positive for COVID infection on 11/14.  Patient was started on Paxlovid, but he could not tolerate this medication.  Patient has severe nausea and vomiting.  Patient was seen in the ED and negative chest x-ray yesterday. Patient states that he does not have cough, shortness breath, fever or chills anymore. He has persistence of nausea and vomiting, cannot keep anything down.  He feels dehydrated.  He has lightheadedness, but did not fall.  No abdominal pain or diarrhea.  Patient does not have symptoms of UTI.  Data reviewed independently and ED Course: pt was found to have WBC 6.1, troponin level 4, 3, negative urinalysis.  GFR> 60.  Temperature normal, blood pressure 150/83, heart rate 50, RR 18, oxygen saturation 97% on room air.  Patient is placed on MedSurg bed for observation.   EKG on 11/17: I have personally reviewed.  Sinus rhythm, QTc 554, mild T wave inversion in V3-V6.   Review of Systems:   General: no fevers, chills, no body weight gain, has poor appetite, has fatigue HEENT: no blurry vision, hearing changes or sore throat Respiratory: no dyspnea, coughing, wheezing CV: no chest pain, no palpitations GI: Has nausea, vomiting, no abdominal pain, diarrhea, constipation GU: no dysuria, burning on urination, increased urinary frequency, hematuria  Ext: no leg edema Neuro: no unilateral weakness, numbness, or  tingling, no vision change or hearing loss.  Has lightheadedness. Skin: no rash, no skin tear. MSK: No muscle spasm, no deformity, no limitation of range of movement in spin Heme: No easy bruising.  Travel history: No recent long distant travel.   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 He has never used smokeless tobacco. He reports that he does not drink alcohol and does not use drugs.  Family History:  Family History  Problem Relation Age of Onset   Diabetes Mother    Diabetes Father      Prior to Admission medications   Medication Sig Start Date End Date Taking? Authorizing Provider  ondansetron (ZOFRAN-ODT) 4 MG disintegrating tablet Take 1 tablet (4 mg total) by mouth every 8 (eight) hours as needed for nausea or vomiting. 05/16/22   Merlyn Lot, MD    Physical Exam: Vitals:   05/17/22 1007 05/17/22 1501 05/17/22 1700  BP: (!) 164/92 (!) 150/83 (!) 150/76  Pulse: (!) 50 60 66  Resp: 18 18 16   Temp: (!) 96.7 F (35.9 C) 97.6 F (36.4 C) 98 F (36.7 C)  TempSrc:  Oral Oral  SpO2: 98% 97% 98%   General: Not in acute distress.  Dry mucosal membrane HEENT:       Eyes: PERRL, EOMI,  no scleral icterus.       ENT: No discharge from the ears and nose, no pharynx injection, no tonsillar enlargement.        Neck: No JVD, no bruit, no mass felt. Heme: No neck lymph node enlargement. Cardiac: S1/S2, RRR, No murmurs, No gallops or rubs. Respiratory: No rales, wheezing, rhonchi or rubs. GI: Soft, nondistended, nontender, no rebound pain, no organomegaly, BS present. GU: No hematuria Ext: No pitting leg edema bilaterally. 1+DP/PT pulse bilaterally. Musculoskeletal: No joint deformities, No joint redness or warmth, no limitation of ROM in spin. Skin:  No rashes.  Neuro: Alert, oriented X3, cranial nerves II-XII grossly intact, moves all extremities normally.  Psych: Patient is not psychotic, no suicidal or hemocidal ideation.  Labs on Admission: I have personally reviewed following labs and imaging studies  CBC: Recent Labs  Lab 05/16/22 0945 05/17/22 1008  WBC 6.5 6.1  NEUTROABS 4.6  --   HGB 17.0 16.5  HCT 49.5 49.3  MCV 95.0 95.7  PLT 218 228   Basic Metabolic Panel: Recent Labs  Lab 05/16/22 0945 05/17/22 1008  NA 138 142  K 3.3* 3.9  CL 108 111  CO2 20* 22  GLUCOSE 118* 125*  BUN 13 13  CREATININE 0.96 0.98  CALCIUM 9.3 9.7   GFR: Estimated Creatinine Clearance: 80.2 mL/min (by C-G formula based on SCr of 0.98 mg/dL). Liver Function Tests: Recent Labs  Lab 05/16/22 0945  AST 16  ALT 13  ALKPHOS 83  BILITOT 0.7  PROT 7.7  ALBUMIN 3.8   No results for input(s): "LIPASE", "AMYLASE" in the last 168 hours. No results for input(s): "AMMONIA" in the last 168 hours. Coagulation Profile: No results for input(s): "INR", "PROTIME" in the last 168 hours. Cardiac Enzymes: No results for input(s): "CKTOTAL", "CKMB", "CKMBINDEX", "TROPONINI" in the last 168 hours. BNP (last 3 results) No results for input(s): "PROBNP" in the last 8760 hours. HbA1C: No results for input(s): "HGBA1C" in the last 72 hours. CBG: No results for input(s): "GLUCAP" in the last 168 hours. Lipid Profile: No results for input(s): "CHOL", "HDL", "LDLCALC", "TRIG", "CHOLHDL", "LDLDIRECT" in the last 72 hours. Thyroid Function Tests: No results for input(s): "TSH", "T4TOTAL", "FREET4", "T3FREE", "THYROIDAB" in the last 72 hours. Anemia Panel: No results for input(s): "VITAMINB12", "FOLATE", "FERRITIN", "TIBC", "IRON", "RETICCTPCT" in the last 72 hours. Urine analysis:    Component Value Date/Time   COLORURINE YELLOW (A) 05/17/2022 1510   APPEARANCEUR CLEAR (A) 05/17/2022 1510   LABSPEC 1.010 05/17/2022 1510   PHURINE 7.0 05/17/2022  1510   GLUCOSEU NEGATIVE 05/17/2022 1510   HGBUR NEGATIVE 05/17/2022 1510   BILIRUBINUR NEGATIVE 05/17/2022 1510   KETONESUR 5 (A) 05/17/2022 1510   PROTEINUR NEGATIVE 05/17/2022 1510   NITRITE NEGATIVE 05/17/2022 1510   LEUKOCYTESUR NEGATIVE 05/17/2022 1510   Sepsis Labs: @LABRCNTIP (procalcitonin:4,lacticidven:4) )No results found for this or any previous visit (from the past 240 hour(s)).   Radiological Exams on Admission: CT ABDOMEN PELVIS W CONTRAST  Result Date: 05/17/2022 CLINICAL DATA:  Vomiting and abdominal pain for approximately 1 week. EXAM: CT ABDOMEN AND PELVIS WITH CONTRAST TECHNIQUE: Multidetector CT imaging of the abdomen and pelvis was performed using the standard protocol following bolus administration of intravenous contrast. RADIATION DOSE REDUCTION: This exam was performed according to the departmental dose-optimization program which includes automated exposure control, adjustment of the mA and/or kV according to patient size and/or use of iterative reconstruction technique. CONTRAST:  05/19/2022 OMNIPAQUE IOHEXOL 300 MG/ML  SOLN COMPARISON:  None  Available. FINDINGS: Lower Chest: No acute findings. Hepatobiliary: No hepatic masses identified. Gallbladder is unremarkable. No evidence of biliary ductal dilatation. Pancreas:  No mass or inflammatory changes. Spleen: Within normal limits in size and appearance. Adrenals/Urinary Tract: No suspicious masses identified. Mild right renal parenchymal scarring noted. No evidence of ureteral calculi or hydronephrosis. Distended urinary bladder is seen showing multiple bladder diverticula, which could be due to chronic bladder outlet obstruction or neurogenic bladder. Stomach/Bowel: Small hiatal hernia noted. No evidence of obstruction, inflammatory process or abnormal fluid collections. Normal appendix visualized. Vascular/Lymphatic: No pathologically enlarged lymph nodes. No acute vascular findings. Aortic atherosclerotic calcification  incidentally noted. Reproductive: Normal size prostate. No mass or other significant abnormality. Other: Small to moderate bilateral inguinal hernias are seen, which contain only fat. A tiny midline epigastric ventral hernia is seen, which contains only fat. Musculoskeletal:  No suspicious bone lesions identified. IMPRESSION: No acute findings within the abdomen or pelvis. Distended urinary bladder with multiple diverticula, which could be due to chronic bladder outlet obstruction or neurogenic bladder. Small hiatal hernia. Small to moderate bilateral inguinal hernias, and tiny epigastric ventral hernia, all containing only fat. Aortic Atherosclerosis (ICD10-I70.0). Electronically Signed   By: Marlaine Hind M.D.   On: 05/17/2022 15:46   DG Abd 2 Views  Result Date: 05/17/2022 CLINICAL DATA:  Vomiting EXAM: ABDOMEN - 2 VIEW COMPARISON:  None Available. FINDINGS: Bowel gas is present throughout the colon. Relative paucity of bowel gas in the central abdomen. No free air or pneumatosis. No abnormal radio-opaque calculi or mass effect. No acute or substantial osseous abnormality. Partially imaged lung bases are clear. IMPRESSION: 1. Relative paucity of bowel gas in the central abdomen. 2. Nonobstructive bowel gas pattern with bowel gas present throughout the colon. Electronically Signed   By: Darrin Nipper M.D.   On: 05/17/2022 13:23   DG Chest 2 View  Result Date: 05/16/2022 CLINICAL DATA:  Chest pain and shortness of breath. Recent diagnosis of COVID infection. Headache. EXAM: CHEST - 2 VIEW COMPARISON:  None Available. FINDINGS: The lungs appear clear. Cardiac and mediastinal contours normal. No pleural effusion identified. Mild upper thoracic spondylosis. IMPRESSION: 1. No active cardiopulmonary disease is radiographically apparent. 2. Mild upper thoracic spondylosis. Electronically Signed   By: Van Clines M.D.   On: 05/16/2022 10:31      Assessment/Plan Principal Problem:   COVID-19 virus  infection Active Problems:   Dehydration   Nausea & vomiting   Bipolar disorder (HCC)   BPH (benign prostatic hyperplasia)   Assessment and Plan:  COVID-19 virus infection: Patient does not have oxygen desaturation.  Chest x-ray negative yesterday.  Patient has persistent nausea and vomiting.  Patient could not tolerate Paxlovid.  -Placed on telemetry bed for observation -Start molnupiravir -As needed albuterol and Mucinex -Check CRP level  Dehydration due to nausea vomiting: Patient states that he cannot keep anything down currently.  Will need IV fluid. -IV fluid: 2 L normal saline, then 75 cc/h -As needed Benadryl for nausea vomiting (patient has QT prolongation, cannot give Zofran) -check orthostatic vital signs  Bipolar disorder (HCC) -Continue home medications: Lithium and Wellbutrin -Hold trazodone due to QTc prolonged patient  BPH (benign prostatic hyperplasia) -Flomax    DVT ppx:  SQ Lovenox  Code Status: Full code  Family Communication:   Yes, patient's wife at bed side.  Disposition Plan:  Anticipate discharge back to previous environment  Consults called:  none  Admission status and Level of care: Med-Surg:   for obs  Dispo: The patient is from: Home              Anticipated d/c is to: Home              Anticipated d/c date is: 1 day              Patient currently is not medically stable to d/c.    Severity of Illness:  The appropriate patient status for this patient is OBSERVATION. Observation status is judged to be reasonable and necessary in order to provide the required intensity of service to ensure the patient's safety. The patient's presenting symptoms, physical exam findings, and initial radiographic and laboratory data in the context of their medical condition is felt to place them at decreased risk for further clinical deterioration. Furthermore, it is anticipated that the patient will be medically stable for discharge from the hospital  within 2 midnights of admission.        Date of Service 05/17/2022    [facility]   If 7PM-7AM, please contact night-coverage [email or web address] 05/17/2022, 5:39 PM

## 2022-05-18 DIAGNOSIS — R112 Nausea with vomiting, unspecified: Secondary | ICD-10-CM | POA: Diagnosis not present

## 2022-05-18 DIAGNOSIS — R9431 Abnormal electrocardiogram [ECG] [EKG]: Secondary | ICD-10-CM | POA: Diagnosis present

## 2022-05-18 DIAGNOSIS — E876 Hypokalemia: Secondary | ICD-10-CM

## 2022-05-18 DIAGNOSIS — U071 COVID-19: Secondary | ICD-10-CM | POA: Diagnosis not present

## 2022-05-18 HISTORY — DX: Hypokalemia: E87.6

## 2022-05-18 LAB — BASIC METABOLIC PANEL
Anion gap: 6 (ref 5–15)
BUN: 12 mg/dL (ref 6–20)
CO2: 23 mmol/L (ref 22–32)
Calcium: 8.7 mg/dL — ABNORMAL LOW (ref 8.9–10.3)
Chloride: 113 mmol/L — ABNORMAL HIGH (ref 98–111)
Creatinine, Ser: 1.04 mg/dL (ref 0.61–1.24)
GFR, Estimated: >60 mL/min (ref >60)
Glucose, Bld: 93 mg/dL (ref 70–99)
Potassium: 3.7 mmol/L (ref 3.5–5.1)
Sodium: 142 mmol/L (ref 135–145)

## 2022-05-18 LAB — MAGNESIUM: Magnesium: 2.3 mg/dL (ref 1.7–2.4)

## 2022-05-18 LAB — HIV ANTIBODY (ROUTINE TESTING W REFLEX): HIV Screen 4th Generation wRfx: NONREACTIVE

## 2022-05-18 LAB — LITHIUM LEVEL: Lithium Lvl: 0.56 mmol/L — ABNORMAL LOW (ref 0.60–1.20)

## 2022-05-18 MED ORDER — BUTALBITAL-APAP-CAFFEINE 50-325-40 MG PO TABS
1.0000 | ORAL_TABLET | Freq: Four times a day (QID) | ORAL | Status: DC | PRN
  Administered 2022-05-18 – 2022-05-19 (×3): 1 via ORAL
  Filled 2022-05-18 (×3): qty 1

## 2022-05-18 MED ORDER — LACTATED RINGERS IV SOLN: INTRAVENOUS | Status: DC

## 2022-05-18 NOTE — Assessment & Plan Note (Signed)
Mild, K on admission was 3.4. Resolved. Monitor BMP. Maintain K>4.0 due to prolonged QT

## 2022-05-18 NOTE — Assessment & Plan Note (Signed)
Continue molnupiravir. Supportive care per orders. Airborne and contact precautions.

## 2022-05-18 NOTE — Progress Notes (Signed)
Progress Note   Patient: Andrew Mcclure SWF:[id number] DOB: Feb 19, 1961 DOA: 05/17/2022     0 DOS: the patient was seen and examined on 05/18/2022   Brief hospital course: Andrew Mcclure is a 61 y.o. male with medical history significant of bipolar disorder, recently diagnosed with with COVID infection on 11/14, who presented on 05/17/2022 for evaluation of nausea vomiting and lightheadedness.  He had tested positive for Covid on 11/4, started on Paxlovid.  Now with severe N/V and inability to keep down any PO intake including water.    ED course -- afebrile (initial temp low at 96.7 F), BP 164/92, HR 50.  Labs with no leukocytosis, normal CBC, normal troponin x 2.  Initial BMP with mild hypokalemia (K 3.3).  CRP was normal < 0.5.  CT abdomen pelvis was negative for acute findings to explain symptoms.  Chest xray with no evidence of pneumonia.  EKG noted to have prolonged Qtc of 554 with non-specific T wave changes laterally.    Admitted to medicine service and started on IV fluids given inability to tolerate any PO intake.  Molpuniravir started for Covid-specific treatment.    11/19 -- pt reports headache not relieved by Tylenol.  Tolerated breakfast this AM, first PO intake in at least 4 days.  Assessment and Plan: * COVID-19 virus infection Continue molnupiravir. Supportive care per orders. Airborne and contact precautions.  Nausea & vomiting Likely due to combination of Covid itself, and side effect of Paxlovid.   --Continue IV Benadryl for antiemetic (due to prolonged Qtc) --Continue IV fluids --Diet as tolerated --Encourage PO intake as tolerated  Dehydration Due to intractable N/V.  Improving. Continue IV fluids another 24 hours and monitor tolerance for PO intake.  Hypokalemia Mild, K on admission was 3.4. Resolved. Monitor BMP. Maintain K>4.0 due to prolonged QT  Prolonged QT interval Qtc on EKG 11/18 was 554. --Using Benadryl for N/V --Need to continue his lithium for  bipolar d/o --Minimize other QT prolonging meds --Monitor and replace electrolytes (goal K>4, Mg>2) --Serial EKG to monitor  Bipolar disorder (HCC) Continue lithium, Wellbutrin Check lithium level, added on to AM blood draw        Subjective: Pt seen with wife at bedside this AM.  He tolerated breakfast today, first PO intake that stayed down in over 4 days.  He reports a bad headache, not relieved by Tylenol.    Physical Exam: Vitals:   05/17/22 1700 05/17/22 2138 05/18/22 0402 05/18/22 0834  BP: (!) 150/76 (!) 163/85 113/74 120/75  Pulse: 66 (!) 52 (!) 48 (!) 49  Resp: 16 16 16 18   Temp: 98 F (36.7 C) 98 F (36.7 C) 98.8 F (37.1 C) 98.1 F (36.7 C)  TempSrc: Oral     SpO2: 98% 99% 97% 97%  Weight: 76 kg     Height: 5\' 9"  (1.753 m)      General exam: awake, alert, no acute distress, appears mildly ill HEENT: moist mucus membranes, hearing grossly normal  Respiratory system: CTAB, no wheezes, rales or rhonchi, normal respiratory effort. Cardiovascular system: normal S1/S2, RRR, no pedal edema.   Gastrointestinal system: soft, NT, ND, no HSM felt, +bowel sounds. Central nervous system: A&O x4. no gross focal neurologic deficits, normal speech Extremities: moves all, no edema, normal tone Skin: dry, intact, normal temperature, normal color, No rashes, lesions or ulcers Psychiatry: normal mood, congruent affect, judgement and insight appear normal   Data Reviewed:  Notable labs -- Cl 113, Ca 8.7  Family Communication: wife  at bedside on rounds  Disposition: Status is: Observation The patient remains OBS appropriate and will d/c before 2 midnights. Remains admitted for further IV hydration while monitoring for PO tolerance, headaches   Planned Discharge Destination: Home    Time spent: 40 minutes  Author: Pennie Banter, DO 05/18/2022 2:18 PM  For on call review [email or web address].

## 2022-05-18 NOTE — Hospital Course (Signed)
Andrew Mcclure is a 61 y.o. male with medical history significant of bipolar disorder, recently diagnosed with with COVID infection on 11/14, who presented on 05/17/2022 for evaluation of nausea vomiting and lightheadedness.  He had tested positive for Covid on 11/4, started on Paxlovid.  Now with severe N/V and inability to keep down any PO intake including water.    ED course -- afebrile (initial temp low at 96.7 F), BP 164/92, HR 50.  Labs with no leukocytosis, normal CBC, normal troponin x 2.  Initial BMP with mild hypokalemia (K 3.3).  CRP was normal < 0.5.  CT abdomen pelvis was negative for acute findings to explain symptoms.  Chest xray with no evidence of pneumonia.  EKG noted to have prolonged Qtc of 554 with non-specific T wave changes laterally.    Admitted to medicine service and started on IV fluids given inability to tolerate any PO intake.  Molpuniravir started for Covid-specific treatment.    11/19 -- pt reports headache not relieved by Tylenol.  Tolerated breakfast this AM, first PO intake in at least 4 days.

## 2022-05-18 NOTE — Assessment & Plan Note (Signed)
Qtc on EKG 11/18 was 554. --Using Benadryl for N/V --Need to continue his lithium for bipolar d/o --Minimize other QT prolonging meds --Monitor and replace electrolytes (goal K>4, Mg>2) --Serial EKG to monitor

## 2022-05-18 NOTE — Assessment & Plan Note (Signed)
Due to intractable N/V.  Improving. Continue IV fluids another 24 hours and monitor tolerance for PO intake.

## 2022-05-18 NOTE — Assessment & Plan Note (Addendum)
Continue lithium, Wellbutrin Check lithium level, added on to AM blood draw

## 2022-05-18 NOTE — Assessment & Plan Note (Addendum)
Likely due to combination of Covid itself, and side effect of Paxlovid.   --Continue IV Benadryl for antiemetic (due to prolonged Qtc) --Continue IV fluids --Diet as tolerated --Encourage PO intake as tolerated

## 2022-05-19 DIAGNOSIS — U071 COVID-19: Secondary | ICD-10-CM | POA: Diagnosis not present

## 2022-05-19 LAB — COMPREHENSIVE METABOLIC PANEL
ALT: 33 U/L (ref 0–44)
AST: 34 U/L (ref 15–41)
Albumin: 3.6 g/dL (ref 3.5–5.0)
Alkaline Phosphatase: 69 U/L (ref 38–126)
Anion gap: 5 (ref 5–15)
BUN: 11 mg/dL (ref 6–20)
CO2: 25 mmol/L (ref 22–32)
Calcium: 9 mg/dL (ref 8.9–10.3)
Chloride: 111 mmol/L (ref 98–111)
Creatinine, Ser: 0.98 mg/dL (ref 0.61–1.24)
GFR, Estimated: >60 mL/min (ref >60)
Glucose, Bld: 108 mg/dL — ABNORMAL HIGH (ref 70–99)
Potassium: 3.5 mmol/L (ref 3.5–5.1)
Sodium: 141 mmol/L (ref 135–145)
Total Bilirubin: 0.9 mg/dL (ref 0.3–1.2)
Total Protein: 6.7 g/dL (ref 6.5–8.1)

## 2022-05-19 MED ORDER — BUTALBITAL-APAP-CAFFEINE 50-325-40 MG PO TABS: 1.0000 | ORAL_TABLET | Freq: Four times a day (QID) | ORAL | 0 refills | Status: DC | PRN

## 2022-05-19 MED ORDER — DIPHENHYDRAMINE HCL 25 MG PO TABS: 25.0000 mg | ORAL_TABLET | Freq: Four times a day (QID) | ORAL | 0 refills | Status: DC | PRN

## 2022-05-19 NOTE — Discharge Summary (Signed)
Physician Discharge Summary   Patient: Andrew Mcclure MRN: [id number] DOB: 09-Aug-1960  Admit date:     05/17/2022  Discharge date: 05/20/22  Discharge Physician: Andrew Mcclure   PCP: Andrew Bonus, MD   Recommendations at discharge:    Follow up with Primary Care in 1-2 weeks Repeat BMP, CBC in 1-2 weeks  Discharge Diagnoses: Principal Problem:   COVID-19 virus infection Active Problems:   Dehydration   Prolonged QT interval   Bipolar disorder (HCC)   BPH (benign prostatic hyperplasia)  Resolved Problems:   Nausea & vomiting   Hypokalemia  Hospital Course: [name] is a 61 y.o. male with medical history significant of bipolar disorder, recently diagnosed with with COVID infection on 11/14, who presented on 05/17/2022 for evaluation of nausea vomiting and lightheadedness.  He had tested positive for Covid on 11/4, started on Paxlovid.  Now with severe N/V and inability to keep down any PO intake including water.    ED course -- afebrile (initial temp low at 96.7 F), BP 164/92, HR 50.  Labs with no leukocytosis, normal CBC, normal troponin x 2.  Initial BMP with mild hypokalemia (K 3.3).  CRP was normal < 0.5.  CT abdomen pelvis was negative for acute findings to explain symptoms.  Chest xray with no evidence of pneumonia.  EKG noted to have prolonged Qtc of 554 with non-specific T wave changes laterally.    Admitted to medicine service and started on IV fluids given inability to tolerate any PO intake.  Molpuniravir started for Covid-specific treatment.    11/19 -- pt reports headache not relieved by Tylenol.  Tolerated breakfast this AM, first PO intake in at least 4 days.  11/20 -- patient's symptoms improved. Tolerating diet well.  He declined to continue molnupiravir. Clinically stable for discharge home today.   Assessment and Plan: * COVID-19 virus infection Continue molnupiravir. Supportive care per orders. Airborne and contact  precautions.  Dehydration Due to intractable N/V.  Improving. Continue IV fluids another 24 hours and monitor tolerance for PO intake.  Nausea & vomiting-resolved as of 05/20/2022 Likely due to combination of Covid itself, and side effect of Paxlovid.   --Continue IV Benadryl for antiemetic (due to prolonged Qtc) --Continue IV fluids --Diet as tolerated --Encourage PO intake as tolerated  Hypokalemia-resolved as of 05/20/2022 Mild, K on admission was 3.4. Resolved. Monitor BMP. Maintain K>4.0 due to prolonged QT  Prolonged QT interval Qtc on EKG 11/18 was 554. --Using Benadryl for N/V --Need to continue his lithium for bipolar d/o --Minimize other QT prolonging meds --Monitor and replace electrolytes (goal K>4, Mg>2) --Serial EKG to monitor  Bipolar disorder (HCC) Continue lithium, Wellbutrin Check lithium level, added on to AM blood draw         Consultants: none Procedures performed: none  Disposition: Home Diet recommendation:  Discharge Diet Orders (From admission, onward)     Start     Ordered   05/19/22 0000  Diet - low sodium heart healthy        05/19/22 1309           Cardiac diet DISCHARGE MEDICATION: Allergies as of 05/19/2022   No Known Allergies      Medication List     STOP taking these medications    nirmatrelvir & ritonavir 20 x 150 MG & 10 x 100MG  Tbpk Commonly known as: PAXLOVID   ondansetron 4 MG disintegrating tablet Commonly known as: ZOFRAN-ODT       TAKE these medications  buPROPion 300 MG 24 hr tablet Commonly known as: WELLBUTRIN XL Take 300 mg by mouth daily.   butalbital-acetaminophen-caffeine 50-325-40 MG tablet Commonly known as: FIORICET Take 1 tablet by mouth every 6 (six) hours as needed for headache.   diphenhydrAMINE 25 MG tablet Commonly known as: Benadryl Allergy Take 1 tablet (25 mg total) by mouth every 6 (six) hours as needed (nausea or sleep).   ibuprofen 800 MG tablet Commonly known as:  ADVIL Take 800 mg by mouth every 8 (eight) hours as needed.   lithium carbonate 300 MG CR tablet Commonly known as: LITHOBID Take 300 mg by mouth at bedtime. Take with the 450 mg tablet QHS = 750 mg QHS   lithium carbonate 450 MG CR tablet Commonly known as: ESKALITH Take 450 mg by mouth 2 (two) times daily.   tamsulosin 0.4 MG Caps capsule Commonly known as: FLOMAX Take 0.4 mg by mouth daily.   traZODone 50 MG tablet Commonly known as: DESYREL Take 2 tablets by mouth at bedtime.   triamcinolone 0.1 % paste Commonly known as: KENALOG Apply 1 Application topically 2 (two) times daily.        Discharge Exam: Filed Weights   05/17/22 1700  Weight: 76 kg   General exam: awake, alert, no acute distress HEENT: atraumatic, clear conjunctiva, anicteric sclera, moist mucus membranes, hearing grossly normal  Respiratory system: CTAB, no wheezes, rales or rhonchi, normal respiratory effort. Cardiovascular system: normal S1/S2, RRR, no JVD, murmurs, rubs, gallops,  no pedal edema.   Gastrointestinal system: soft, NT, ND, no HSM felt, +bowel sounds. Central nervous system: A&O x4. no gross focal neurologic deficits, normal speech Extremities: moves all, no edema, normal tone Skin: dry, intact, normal temperature, normal color, No rashes, lesions or ulcers Psychiatry: normal mood, congruent affect, judgement and insight appear normal   Condition at discharge: stable  The results of significant diagnostics from this hospitalization (including imaging, microbiology, ancillary and laboratory) are listed below for reference.   Imaging Studies: CT ABDOMEN PELVIS W CONTRAST  Result Date: 05/17/2022 CLINICAL DATA:  Vomiting and abdominal pain for approximately 1 week. EXAM: CT ABDOMEN AND PELVIS WITH CONTRAST TECHNIQUE: Multidetector CT imaging of the abdomen and pelvis was performed using the standard protocol following bolus administration of intravenous contrast. RADIATION DOSE  REDUCTION: This exam was performed according to the departmental dose-optimization program which includes automated exposure control, adjustment of the mA and/or kV according to patient size and/or use of iterative reconstruction technique. CONTRAST:  OMNIPAQUE IOHEXOL 300 MG/ML  SOLN COMPARISON:  None Available. FINDINGS: Lower Chest: No acute findings. Hepatobiliary: No hepatic masses identified. Gallbladder is unremarkable. No evidence of biliary ductal dilatation. Pancreas:  No mass or inflammatory changes. Spleen: Within normal limits in size and appearance. Adrenals/Urinary Tract: No suspicious masses identified. Mild right renal parenchymal scarring noted. No evidence of ureteral calculi or hydronephrosis. Distended urinary bladder is seen showing multiple bladder diverticula, which could be due to chronic bladder outlet obstruction or neurogenic bladder. Stomach/Bowel: Small hiatal hernia noted. No evidence of obstruction, inflammatory process or abnormal fluid collections. Normal appendix visualized. Vascular/Lymphatic: No pathologically enlarged lymph nodes. No acute vascular findings. Aortic atherosclerotic calcification incidentally noted. Reproductive: Normal size prostate. No mass or other significant abnormality. Other: Small to moderate bilateral inguinal hernias are seen, which contain only fat. A tiny midline epigastric ventral hernia is seen, which contains only fat. Musculoskeletal:  No suspicious bone lesions identified. IMPRESSION: No acute findings within the abdomen or pelvis. Distended urinary bladder  with multiple diverticula, which could be due to chronic bladder outlet obstruction or neurogenic bladder. Small hiatal hernia. Small to moderate bilateral inguinal hernias, and tiny epigastric ventral hernia, all containing only fat. Aortic Atherosclerosis (ICD10-I70.0). Electronically Signed   By: Danae Orleans M.D.   On: 05/17/2022 15:46   DG Abd 2 Views  Result Date:  05/17/2022 CLINICAL DATA:  Vomiting EXAM: ABDOMEN - 2 VIEW COMPARISON:  None Available. FINDINGS: Bowel gas is present throughout the colon. Relative paucity of bowel gas in the central abdomen. No free air or pneumatosis. No abnormal radio-opaque calculi or mass effect. No acute or substantial osseous abnormality. Partially imaged lung bases are clear. IMPRESSION: 1. Relative paucity of bowel gas in the central abdomen. 2. Nonobstructive bowel gas pattern with bowel gas present throughout the colon. Electronically Signed   By: Agustin Cree M.D.   On: 05/17/2022 13:23   DG Chest 2 View  Result Date: 05/16/2022 CLINICAL DATA:  Chest pain and shortness of breath. Recent diagnosis of COVID infection. Headache. EXAM: CHEST - 2 VIEW COMPARISON:  None Available. FINDINGS: The lungs appear clear. Cardiac and mediastinal contours normal. No pleural effusion identified. Mild upper thoracic spondylosis. IMPRESSION: 1. No active cardiopulmonary disease is radiographically apparent. 2. Mild upper thoracic spondylosis. Electronically Signed   By: Gaylyn Rong M.D.   On: 05/16/2022 10:31    Microbiology: No results found for this or any previous visit.  Labs: CBC: Recent Labs  Lab 05/16/22 0945 05/17/22 1008  WBC 6.5 6.1  NEUTROABS 4.6  --   HGB 17.0 16.5  HCT 49.5 49.3  MCV 95.0 95.7  PLT 218 228   Basic Metabolic Panel: Recent Labs  Lab 05/16/22 0945 05/17/22 1008 05/18/22 0500 05/18/22 0503 05/19/22 0514  NA 138 142  --  142 141  K 3.3* 3.9  --  3.7 3.5  CL 108 111  --  113* 111  CO2 20* 22  --  23 25  GLUCOSE 118* 125*  --  93 108*  BUN 13 13  --  12 11  CREATININE 0.96 0.98  --  1.04 0.98  CALCIUM 9.3 9.7  --  8.7* 9.0  MG  --   --  2.3  --   --    Liver Function Tests: Recent Labs  Lab 05/16/22 0945 05/19/22 0514  AST 16 34  ALT 13 33  ALKPHOS 83 69  BILITOT 0.7 0.9  PROT 7.7 6.7  ALBUMIN 3.8 3.6   CBG: No results for input(s): "GLUCAP" in the last 168  hours.  Discharge time spent: less than 30 minutes.  Signed: Pennie Banter, DO [facility] 05/20/2022

## 2022-05-19 NOTE — Progress Notes (Signed)
Patient being discharged home. IV removed. Went over discharge instructions with patient. Stated he understood. Patient going home POV with wife.

## 2022-05-19 NOTE — TOC CM/SW Note (Signed)
  Transition of Care [facility]) Screening Note   Patient Details  Name: Andrew Mcclure Date of Birth: [birth year]   Transition of Care [facility]) CM/SW Contact:    Tempie Hoist, LCSWA Phone Number: 05/19/2022, 3:19 PM    Transition of Care Department [facility]) has reviewed patient and no TOC needs have been identified at this time. We will continue to monitor patient advancement through interdisciplinary progression rounds. If new patient transition needs arise, please place a TOC consult.

## 2022-05-20 ENCOUNTER — Encounter: Payer: Self-pay | Admitting: Internal Medicine

## 2023-05-26 ENCOUNTER — Other Ambulatory Visit
Admission: RE | Admit: 2023-05-26 | Discharge: 2023-05-26 | Disposition: A | Discharge status: Home / Self Care | Payer: No Typology Code available for payment source | Source: Ambulatory Visit | Attending: Gastroenterology | Admitting: Gastroenterology

## 2023-05-26 DIAGNOSIS — R1084 Generalized abdominal pain: Secondary | ICD-10-CM | POA: Insufficient documentation

## 2023-05-26 DIAGNOSIS — R197 Diarrhea, unspecified: Secondary | ICD-10-CM | POA: Diagnosis present

## 2023-05-26 DIAGNOSIS — R194 Change in bowel habit: Secondary | ICD-10-CM | POA: Diagnosis present

## 2023-05-26 LAB — LITHIUM LEVEL: Lithium Lvl: 0.74 mmol/L (ref 0.60–1.20)

## 2023-06-02 ENCOUNTER — Other Ambulatory Visit: Payer: Self-pay | Admitting: Gastroenterology

## 2023-06-02 DIAGNOSIS — R1084 Generalized abdominal pain: Secondary | ICD-10-CM

## 2023-06-02 DIAGNOSIS — R194 Change in bowel habit: Secondary | ICD-10-CM

## 2023-06-02 DIAGNOSIS — R1310 Dysphagia, unspecified: Secondary | ICD-10-CM

## 2023-06-02 DIAGNOSIS — R197 Diarrhea, unspecified: Secondary | ICD-10-CM

## 2023-06-05 IMAGING — CR DG HAND COMPLETE 3+V*L*
1 series · 3 of 3 positions shown · non-contrast
Comparison: None.

CLINICAL DATA: Left hand pain after injury.

EXAM:
LEFT HAND - COMPLETE 3+ VIEW

[Series 1: x hand pa left · 0.14mm/px · 3 of 3 slices shown]
[im 1/3]
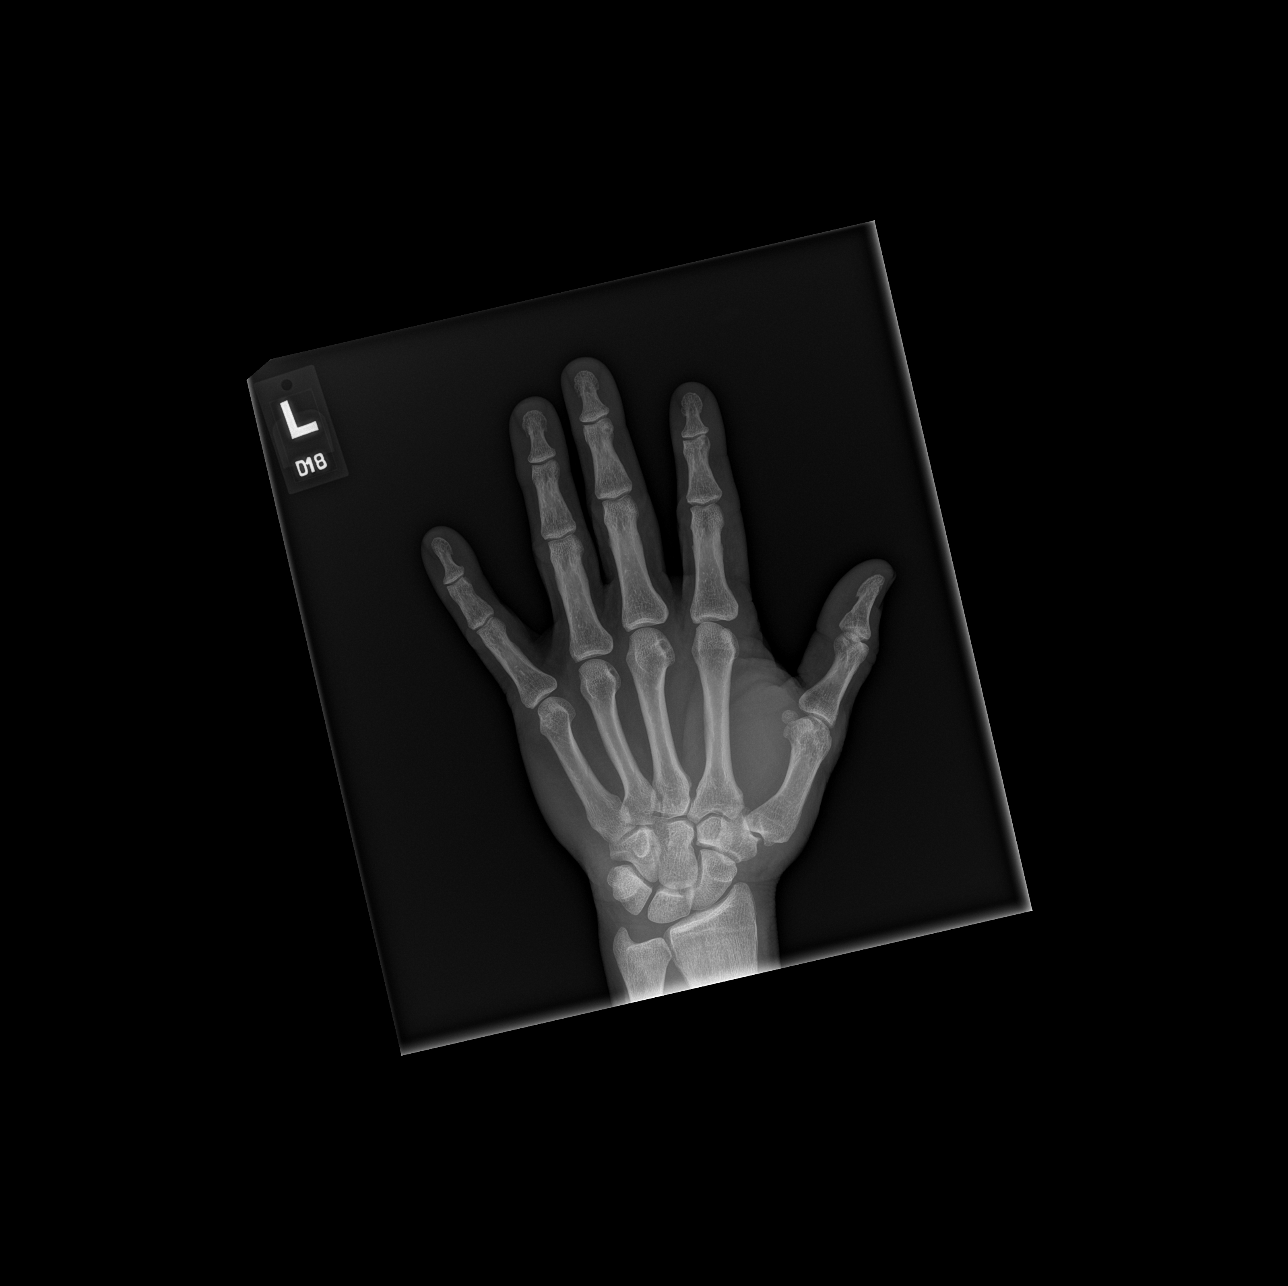
[im 2/3]
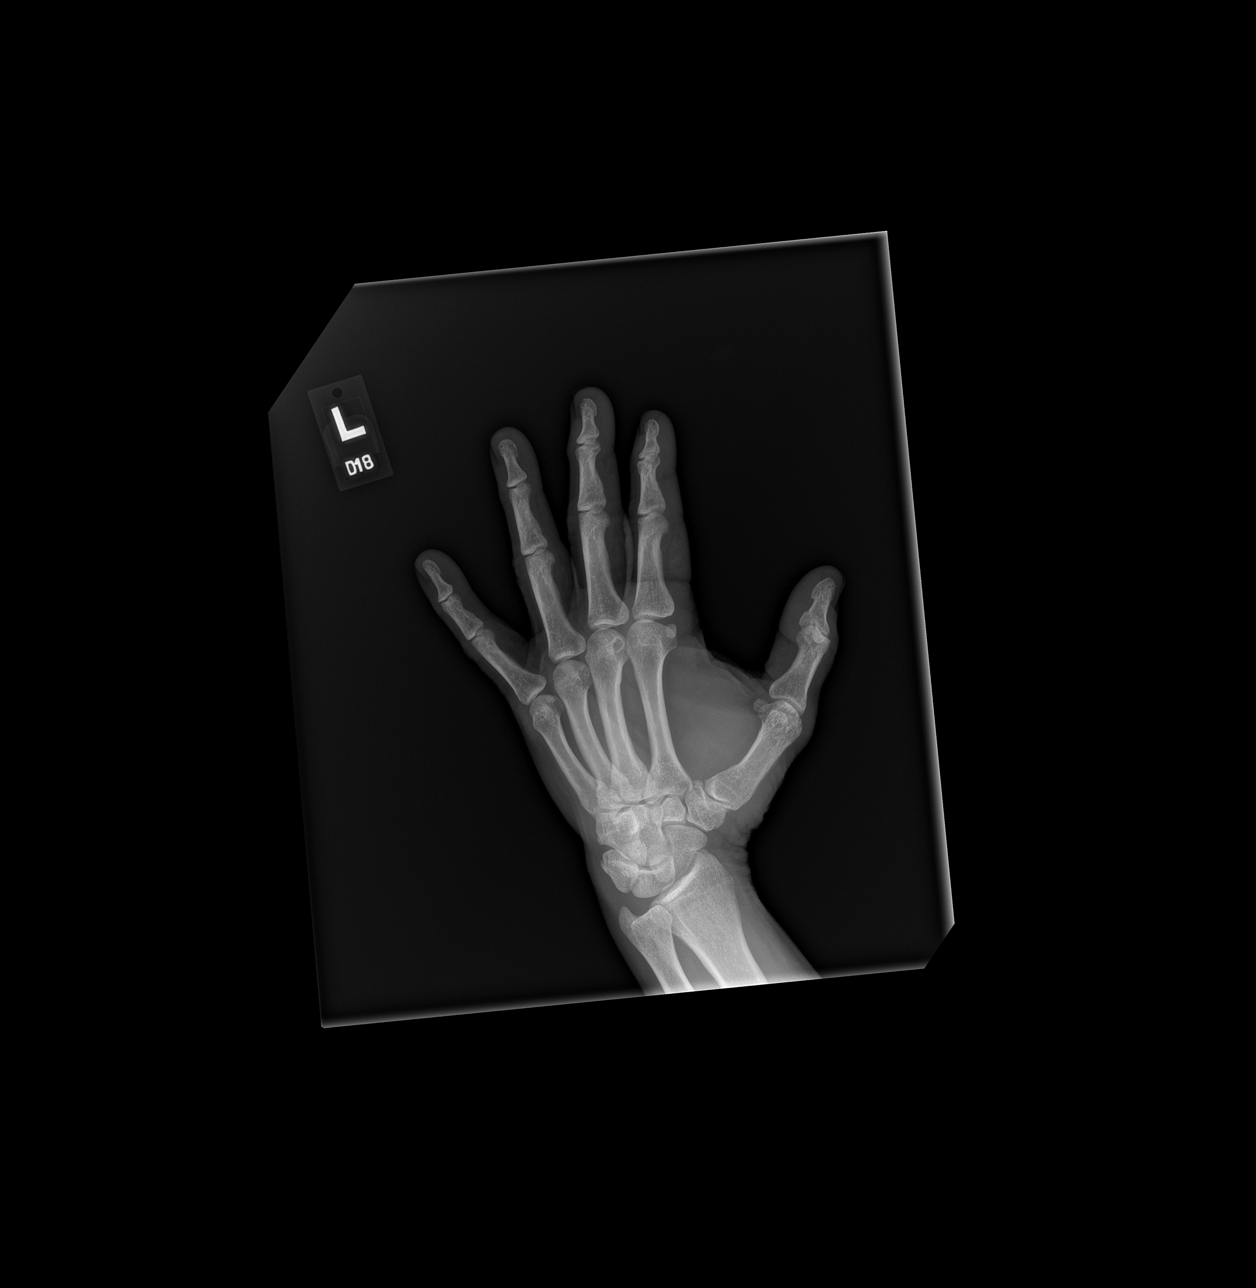
[im 3/3]
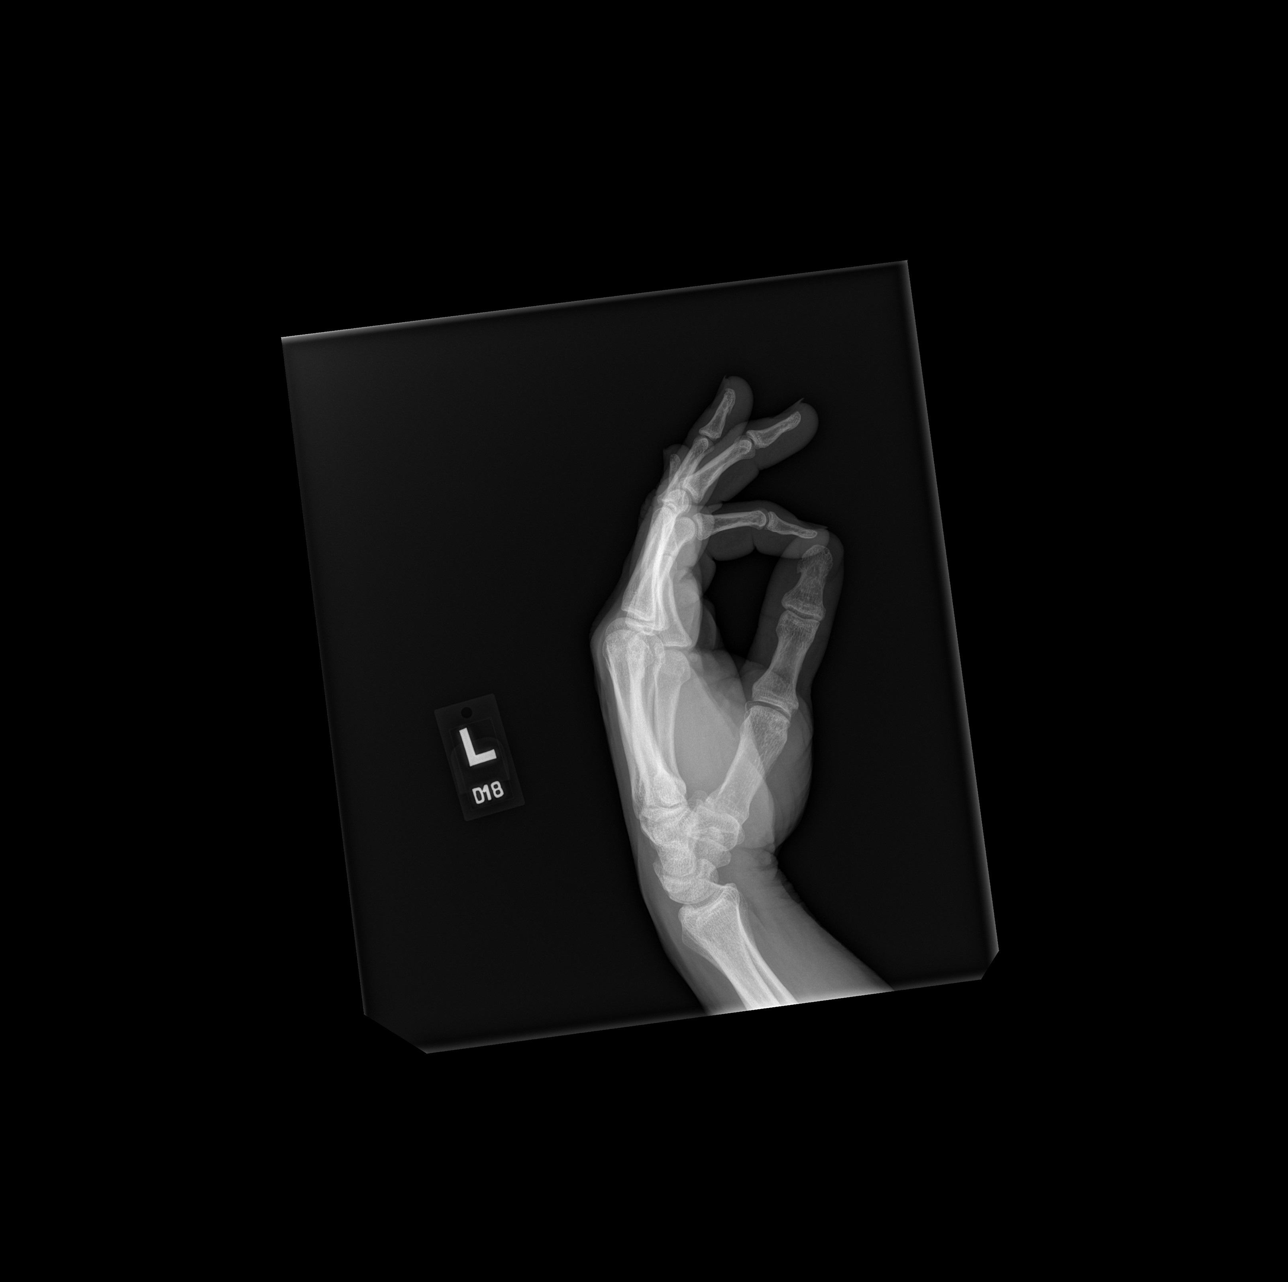

[3 of 3 positions shown; findings below may reference images not displayed]

FINDINGS: There is no evidence of fracture or dislocation. There is no
evidence of arthropathy or other focal bone abnormality. Soft
tissues are unremarkable.
IMPRESSION: Negative.

## 2023-06-10 ENCOUNTER — Ambulatory Visit
Admission: RE | Admit: 2023-06-10 | Discharge: 2023-06-10 | Disposition: A | Discharge status: Home / Self Care | Payer: No Typology Code available for payment source | Source: Ambulatory Visit | Attending: Gastroenterology | Admitting: Gastroenterology

## 2023-06-10 DIAGNOSIS — R1310 Dysphagia, unspecified: Secondary | ICD-10-CM | POA: Insufficient documentation

## 2023-06-10 DIAGNOSIS — R1084 Generalized abdominal pain: Secondary | ICD-10-CM | POA: Insufficient documentation

## 2023-06-10 DIAGNOSIS — R197 Diarrhea, unspecified: Secondary | ICD-10-CM | POA: Diagnosis present

## 2023-06-10 DIAGNOSIS — R194 Change in bowel habit: Secondary | ICD-10-CM | POA: Insufficient documentation

## 2023-06-10 MED ORDER — IOHEXOL 300 MG/ML  SOLN
100.0000 mL | Freq: Once | INTRAMUSCULAR | Status: AC | PRN
  Administered 2023-06-10: 100 mL via INTRAVENOUS

## 2023-07-14 ENCOUNTER — Other Ambulatory Visit: Payer: Self-pay

## 2023-07-14 ENCOUNTER — Emergency Department
Admission: EM | Admit: 2023-07-14 | Discharge: 2023-07-14 | Disposition: A | Discharge status: Home / Self Care | Payer: No Typology Code available for payment source | Attending: Emergency Medicine | Admitting: Emergency Medicine

## 2023-07-14 ENCOUNTER — Emergency Department
Admission: EM | Admit: 2023-07-14 | Discharge: 2023-07-14 | Discharge status: Against Medical Advice | Payer: No Typology Code available for payment source | Source: Home / Self Care

## 2023-07-14 ENCOUNTER — Emergency Department: Payer: No Typology Code available for payment source

## 2023-07-14 ENCOUNTER — Ambulatory Visit: Payer: No Typology Code available for payment source

## 2023-07-14 DIAGNOSIS — R197 Diarrhea, unspecified: Secondary | ICD-10-CM | POA: Diagnosis not present

## 2023-07-14 DIAGNOSIS — Z9889 Other specified postprocedural states: Secondary | ICD-10-CM | POA: Diagnosis not present

## 2023-07-14 DIAGNOSIS — R079 Chest pain, unspecified: Secondary | ICD-10-CM | POA: Insufficient documentation

## 2023-07-14 DIAGNOSIS — R194 Change in bowel habit: Secondary | ICD-10-CM | POA: Diagnosis not present

## 2023-07-14 DIAGNOSIS — R0602 Shortness of breath: Secondary | ICD-10-CM | POA: Insufficient documentation

## 2023-07-14 DIAGNOSIS — R1084 Generalized abdominal pain: Secondary | ICD-10-CM | POA: Diagnosis present

## 2023-07-14 DIAGNOSIS — K2100 Gastro-esophageal reflux disease with esophagitis, without bleeding: Secondary | ICD-10-CM | POA: Diagnosis not present

## 2023-07-14 LAB — CBC
HCT: 45.2 % (ref 39.0–52.0)
Hemoglobin: 15.1 g/dL (ref 13.0–17.0)
MCH: 33.1 pg (ref 26.0–34.0)
MCHC: 33.4 g/dL (ref 30.0–36.0)
MCV: 99.1 fL (ref 80.0–100.0)
Platelets: 230 10*3/uL (ref 150–400)
RBC: 4.56 MIL/uL (ref 4.22–5.81)
RDW: 12.9 % (ref 11.5–15.5)
WBC: 6.4 10*3/uL (ref 4.0–10.5)
nRBC: 0 % (ref 0.0–0.2)

## 2023-07-14 LAB — BASIC METABOLIC PANEL
Anion gap: 12 (ref 5–15)
BUN: 8 mg/dL (ref 8–23)
CO2: 23 mmol/L (ref 22–32)
Calcium: 9 mg/dL (ref 8.9–10.3)
Chloride: 104 mmol/L (ref 98–111)
Creatinine, Ser: 1.22 mg/dL (ref 0.61–1.24)
GFR, Estimated: >60 mL/min (ref >60)
Glucose, Bld: 98 mg/dL (ref 70–99)
Potassium: 3.7 mmol/L (ref 3.5–5.1)
Sodium: 139 mmol/L (ref 135–145)

## 2023-07-14 LAB — TROPONIN I (HIGH SENSITIVITY)
Troponin I (High Sensitivity): 4 ng/L (ref <18)
Troponin I (High Sensitivity): 4 ng/L (ref <18)

## 2023-07-14 NOTE — ED Triage Notes (Signed)
 Pt to ED for chest pain started 30 mins ago. Reports had colonoscopy this am. Reports they saw abnormal EKG, they called him at home and asked him to check bp and it was high

## 2023-07-14 NOTE — ED Provider Notes (Signed)
 [facility] Provider Note    Event Date/Time   First MD Initiated Contact with Patient 07/14/23 1834     (approximate)   History   Chest Pain   HPI Andrew Mcclure is a 63 y.o. male with history of bipolar disorder presenting today for chest pain.  Patient had a colonoscopy earlier today.  Afterwards they apparently mention something possibly abnormal with his EKG and watched him for a little longer.  After being told this, he stated he developed chest pain and was unsure if this was anxiety related as he has had similar issues in the past.  Had 1 episode of shortness of breath but otherwise denies that at this time.  No nausea, vomiting, leg pain, leg swelling.  No prior history of cardiac issues.     Physical Exam   Triage Vital Signs: ED Triage Vitals  Encounter Vitals Group     BP 07/14/23 1618 (!) 168/100     Systolic BP Percentile --      Diastolic BP Percentile --      Pulse Rate 07/14/23 1614 (!) 56     Resp 07/14/23 1614 18     Temp 07/14/23 1618 98.1 F (36.7 C)     Temp src --      SpO2 07/14/23 1618 100 %     Weight 07/14/23 1612 184 lb (83.5 kg)     Height 07/14/23 1612 5' 9 (1.753 m)     Head Circumference --      Peak Flow --      Pain Score 07/14/23 1612 5     Pain Loc --      Pain Education --      Exclude from Growth Chart --     Most recent vital signs: Vitals:   07/14/23 1805 07/14/23 1830  BP: (!) 169/80 (!) 151/84  Pulse: (!) 54 (!) 52  Resp: 17   Temp:    SpO2: 100% 100%   Physical Exam: I have reviewed the vital signs and nursing notes. General: Awake, alert, no acute distress.  Nontoxic appearing. Head:  Atraumatic, normocephalic.   ENT:  EOM intact, PERRL. Oral mucosa is pink and moist with no lesions. Neck: Neck is supple with full range of motion, No meningeal signs. Cardiovascular:  RRR, No murmurs. Peripheral pulses palpable and equal bilaterally. Respiratory:  Symmetrical chest wall expansion.  No  rhonchi, rales, or wheezes.  Good air movement throughout.  No use of accessory muscles.   Musculoskeletal:  No cyanosis or edema. Moving extremities with full ROM Abdomen:  Soft, nontender, nondistended. Neuro:  GCS 15, moving all four extremities, interacting appropriately. Speech clear. Psych:  Calm, appropriate.   Skin:  Warm, dry, no rash.    ED Results / Procedures / Treatments   Labs (all labs ordered are listed, but only abnormal results are displayed) Labs Reviewed  BASIC METABOLIC PANEL  CBC  TROPONIN I (HIGH SENSITIVITY)  TROPONIN I (HIGH SENSITIVITY)     EKG My EKG interpretation: Possible right bundle branch block.  No other acute ST elevations or depressions.  Overall similar to most recent EKG on 05/16/2022   RADIOLOGY Independently to her chest x-ray with no acute findings   PROCEDURES:  Critical Care performed: No  Procedures   MEDICATIONS ORDERED IN ED: Medications - No data to display   IMPRESSION / MDM / ASSESSMENT AND PLAN / ED COURSE  I reviewed the triage vital signs and the nursing notes.  Differential diagnosis includes, but is not limited to, ACS, atypical chest pain, post colonoscopy complication  Patient's presentation is most consistent with acute complicated illness / injury requiring diagnostic workup.  Patient is a 64 year old male presenting today for brief episode of chest pain with no obvious associated symptoms.  Vital signs are stable.  Physical exam largely unremarkable.  EKG consistent with baseline with no ischemia and troponins negative x 2.  CBC and BMP otherwise unremarkable.  Chest x-ray unremarkable.  Patient has a heart score of 2.  No ongoing chest pain and safe for outpatient management at this time.  Also discussed checking his blood pressure at home and following up with his PCP.  Likely related to acute stressor event today.  Will give cardiology follow-up as well.  Patient given strict  return precautions.  The patient is on the cardiac monitor to evaluate for evidence of arrhythmia and/or significant heart rate changes.     FINAL CLINICAL IMPRESSION(S) / ED DIAGNOSES   Final diagnoses:  Chest pain, unspecified type     Rx / DC Orders   ED Discharge Orders          Ordered    Ambulatory referral to Cardiology       Comments: If you have not heard from the Cardiology office within the next 72 hours please call [phone].   07/14/23 1917             Note:  This document was prepared using Dragon voice recognition software and may include unintentional dictation errors.   Malvina Alm DASEN, MD 07/14/23 [phone]

## 2023-07-14 NOTE — ED Provider Triage Note (Signed)
 Emergency Medicine Provider Triage Evaluation Note  Andrew Mcclure , a 63 y.o. male  was evaluated in triage.  Pt complains of chest pain after being discharged from endoscopy and colonoscopy.  Clinic referred to ED  Review of Systems  Positive:  Negative:    Physical Exam  BP (!) 168/100   Pulse (!) 56   Temp 98.1 F (36.7 C)   Resp 18   Ht 5' 9 (1.753 m)   Wt 83.5 kg   SpO2 100%   BMI 27.17 kg/m  Gen:   Awake, no distress   Resp:  Normal effort  MSK:   Moves extremities without difficulty  Other:    Medical Decision Making  Medically screening exam initiated at 4:22 PM.  Appropriate orders placed.  Dennis Hegeman was informed that the remainder of the evaluation will be completed by another provider, this initial triage assessment does not replace that evaluation, and the importance of remaining in the ED until their evaluation is complete.  Patient with chest pain ordered blood work, EKG, chest x-ray   Janit Kast, PA-C 07/14/23 1628

## 2023-07-14 NOTE — Discharge Instructions (Signed)
 Please follow-up with your primary care provider with results of your blood pressure readings over the next 5 days as needed in case you need to be started on blood pressure medication.  Also follow-up with cardiology.  They should call you to set up an appointment.

## 2023-09-10 ENCOUNTER — Emergency Department

## 2023-09-10 ENCOUNTER — Other Ambulatory Visit: Payer: Self-pay

## 2023-09-10 ENCOUNTER — Emergency Department
Admission: EM | Admit: 2023-09-10 | Discharge: 2023-09-10 | Disposition: A | Discharge status: Home / Self Care | Attending: Emergency Medicine | Admitting: Emergency Medicine

## 2023-09-10 DIAGNOSIS — R0602 Shortness of breath: Secondary | ICD-10-CM | POA: Diagnosis not present

## 2023-09-10 DIAGNOSIS — R079 Chest pain, unspecified: Secondary | ICD-10-CM | POA: Diagnosis present

## 2023-09-10 DIAGNOSIS — R002 Palpitations: Secondary | ICD-10-CM | POA: Insufficient documentation

## 2023-09-10 DIAGNOSIS — M940 Chondrocostal junction syndrome [Tietze]: Secondary | ICD-10-CM

## 2023-09-10 DIAGNOSIS — F319 Bipolar disorder, unspecified: Secondary | ICD-10-CM | POA: Diagnosis present

## 2023-09-10 DIAGNOSIS — R001 Bradycardia, unspecified: Secondary | ICD-10-CM | POA: Diagnosis not present

## 2023-09-10 LAB — BASIC METABOLIC PANEL
Anion gap: 7 (ref 5–15)
BUN: 10 mg/dL (ref 8–23)
CO2: 28 mmol/L (ref 22–32)
Calcium: 9 mg/dL (ref 8.9–10.3)
Chloride: 104 mmol/L (ref 98–111)
Creatinine, Ser: 1.05 mg/dL (ref 0.61–1.24)
GFR, Estimated: >60 mL/min (ref >60)
Glucose, Bld: 111 mg/dL — ABNORMAL HIGH (ref 70–99)
Potassium: 3.8 mmol/L (ref 3.5–5.1)
Sodium: 139 mmol/L (ref 135–145)

## 2023-09-10 LAB — CBC
HCT: 44.2 % (ref 39.0–52.0)
Hemoglobin: 15.3 g/dL (ref 13.0–17.0)
MCH: 33.1 pg (ref 26.0–34.0)
MCHC: 34.6 g/dL (ref 30.0–36.0)
MCV: 95.7 fL (ref 80.0–100.0)
Platelets: 207 10*3/uL (ref 150–400)
RBC: 4.62 MIL/uL (ref 4.22–5.81)
RDW: 12.6 % (ref 11.5–15.5)
WBC: 5 10*3/uL (ref 4.0–10.5)
nRBC: 0 % (ref 0.0–0.2)

## 2023-09-10 LAB — TROPONIN I (HIGH SENSITIVITY)
Troponin I (High Sensitivity): 6 ng/L (ref <18)
Troponin I (High Sensitivity): 6 ng/L (ref <18)

## 2023-09-10 LAB — LITHIUM LEVEL: Lithium Lvl: 0.11 mmol/L — ABNORMAL LOW (ref 0.60–1.20)

## 2023-09-10 MED ORDER — MELOXICAM 15 MG PO TABS: 15.0000 mg | ORAL_TABLET | Freq: Every day | ORAL | 0 refills | Status: AC

## 2023-09-10 MED ORDER — KETOROLAC TROMETHAMINE 30 MG/ML IJ SOLN
15.0000 mg | Freq: Once | INTRAMUSCULAR | Status: AC
  Administered 2023-09-10: 15 mg via INTRAVENOUS
  Filled 2023-09-10: qty 1

## 2023-09-10 NOTE — Discharge Instructions (Addendum)
 You may take tylenol or ibuprofen to help with chest pain. You can also use Voltaren gel which is available at pharmacies and apply on your chest to help with the pain.

## 2023-09-10 NOTE — ED Provider Notes (Addendum)
 [facility] Provider Note   Event Date/Time   First MD Initiated Contact with Patient 09/10/23 [phone]     (approximate) History  Chest Pain  HPI Andrew Mcclure is a 63 y.o. male with past medical history of bipolar disorder, prolonged QT, and BPH who presents complaining of left-sided chest pain with radiation to the left arm that is been present intermittently over the last few months.  Patient does have history of bradycardia on EKGs over the last few months as well.  Patient states that he has intermittent palpitations as well as had a presyncopal episode yesterday.  Patient denies any exertional worsening of this chest pain.  Patient does endorse associated shortness of breath ROS: Patient currently denies any vision changes, tinnitus, difficulty speaking, facial droop, sore throat, abdominal pain, nausea/vomiting/diarrhea, dysuria, or weakness/numbness/paresthesias in any extremity   Physical Exam  Triage Vital Signs: ED Triage Vitals  Encounter Vitals Group     BP 09/10/23 0905 (!) 163/98     Systolic BP Percentile --      Diastolic BP Percentile --      Pulse Rate 09/10/23 0905 (!) 53     Resp 09/10/23 0905 18     Temp 09/10/23 0905 97.9 F (36.6 C)     Temp Source 09/10/23 0905 Oral     SpO2 09/10/23 0905 94 %     Weight 09/10/23 0905 185 lb (83.9 kg)     Height 09/10/23 0905 5\' 10"  (1.778 m)     Head Circumference --      Peak Flow --      Pain Score 09/10/23 0904 6     Pain Loc --      Pain Education --      Exclude from Growth Chart --    Most recent vital signs: Vitals:   09/10/23 1100 09/10/23 1130  BP: 116/75 (!) 148/90  Pulse: (!) 48 (!) 49  Resp: 16 16  Temp:    SpO2: 95% 97%   General: Awake, oriented x4. CV:  Good peripheral perfusion.  Resp:  Normal effort.  Abd:  No distention.  Other:  Middle-aged overweight Caucasian male resting comfortably in no acute distress ED Results / Procedures / Treatments  Labs (all labs ordered  are listed, but only abnormal results are displayed) Labs Reviewed  BASIC METABOLIC PANEL - Abnormal; Notable for the following components:      Result Value   Glucose, Bld 111 (*)    All other components within normal limits  LITHIUM LEVEL - Abnormal; Notable for the following components:   Lithium Lvl 0.11 (*)    All other components within normal limits  CBC  TROPONIN I (HIGH SENSITIVITY)  TROPONIN I (HIGH SENSITIVITY)   EKG ED ECG REPORT I, Merwyn Katos, the attending physician, personally viewed and interpreted this ECG. Date: 09/10/2023 EKG Time: 0908 Rate: 49 Rhythm: Bradycardic sinus rhythm QRS Axis: normal Intervals: normal ST/T Wave abnormalities: normal Narrative Interpretation: Bradycardic sinus rhythm.  No evidence of acute ischemia RADIOLOGY ED MD interpretation: 2 view chest x-ray interpreted by me shows no evidence of acute abnormalities including no pneumonia, pneumothorax, or widened mediastinum -Agree with radiology assessment Official radiology report(s): DG Chest 2 View Result Date: 09/10/2023 CLINICAL DATA:  Chest pain. EXAM: CHEST - 2 VIEW COMPARISON:  07/14/2023. FINDINGS: Bilateral lung fields are clear. Bilateral costophrenic angles are clear. Normal cardio-mediastinal silhouette. No acute osseous abnormalities. The soft tissues are within normal limits. IMPRESSION: No active cardiopulmonary  disease. Electronically Signed   By: Jules Schick M.D.   On: 09/10/2023 10:48   PROCEDURES: Critical Care performed: No .1-3 Lead EKG Interpretation  Performed by: Merwyn Katos, MD Authorized by: Merwyn Katos, MD     Interpretation: abnormal     ECG rate:  51   ECG rate assessment: bradycardic     Rhythm: sinus bradycardia     Ectopy: none     Conduction: normal    MEDICATIONS ORDERED IN ED: Medications  ketorolac (TORADOL) 30 MG/ML injection 15 mg (has no administration in time range)   IMPRESSION / MDM / ASSESSMENT AND PLAN / ED COURSE  I  reviewed the triage vital signs and the nursing notes.                             The patient is on the cardiac monitor to evaluate for evidence of arrhythmia and/or significant heart rate changes. Patient's presentation is most consistent with acute presentation with potential threat to life or bodily function. The patient is suffering from bradycardia without concerning signs of instability on exam such as altered mental status, hypotension, evidence of cardiac end organ dysfunction, or acute heart failure.  Ddx: sick sinus syndrome, vasovagal, unstable heart block (ekg with no signs of Mobitz II, complete heart block), right coronary artery myocardial infarction (neg trop_, non STEMI, no chest pain), infection (afebrile, no leukocytosis, no recent illness), hypothyroidism, hyperkalemia, hypoglycemia, dehydration, or intoxication (beta blockade, calcium channel blockade, clonidine, digoxin, opiates, alcohol or other). Workup: CBC, CMP, Trop, EKG, telemetry Results: CBC no evidence of acute abnormalities CMP no evidence of acute abnormalities Trop Neg x1 EKG interpreted by me and shows sinus bradycardia  Patient remained symptomatic with chest pain throughout ED course.  I spoke to Dr. Melton Alar with [facility] cardiology who, after assessing patient at bedside, plans for outpatient cardiac workup and does not see need for admission at this time. Dispo: Discharge home with cardiology follow-up   FINAL CLINICAL IMPRESSION(S) / ED DIAGNOSES   Final diagnoses:  Chest pain, unspecified type  Bradycardia   Rx / DC Orders   ED Discharge Orders     None      Note:  This document was prepared using Dragon voice recognition software and may include unintentional dictation errors.   Merwyn Katos, MD 09/10/23 1158    Merwyn Katos, MD 09/10/23 1430

## 2023-09-10 NOTE — Consult Note (Addendum)
 Initial Consultation Note   Patient: Andrew Mcclure ZOX:[id number] DOB: 27-May-1961 PCP: Judie Bonus, MD DOA: 09/10/2023 DOS: the patient was seen and examined on 09/10/2023 Primary service: Merwyn Katos, MD  Referring physician: bradler MD  Reason for consult: Chest pain   Assessment/Plan: Assessment and Plan: * Chest pain Costochondritis Recurrence of central chest pain with moderate intensity with some ? radiation of the neck and down the left arm x 3 to 4 weeks Pain seems to spike while associated with work activities-noted baseline heavy labor Marked anterior chest wall tenderness palpation exam concerning for costochondritis Currently also seeing outpatient cardiology for further evaluation in setting of bradycardia Status post formal evaluation by cardiology today with recommendation for outpatient coronary CT Troponin negative x 2 EKG within normal limits Plan for NSAIDs as well as overall rest Will otherwise follow-up cardiology recommendations   Bipolar disorder (HCC) Stable at present  On lithium  Sinus bradycardia Heart rate 40s to 80s Felt to be associated with lithium use per cardiology documentation Will otherwise monitor Follow cardiology recommendations       TRH will sign off at present, please call us again when needed.  HPI: Andrew Mcclure is a 63 y.o. male with past medical history of bipolar disorder presenting with chest pain and bradycardia.  Patient reports recurring episodes of chest pain for the past week or so.  No fevers or chills.  No nausea or vomiting.  Minimal shortness of breath.  Non-smoker.  Chest pain predominately central in nature with some radiation up the neck and down the left arm.  Moderate to severe in intensity.  Chest pain seems to be worsened predominately with working.  Has been seen in the outpatient setting with cardiology for chest pain as well as bradycardia on February 20.  Echocardiogram and cardiac CT was ordered.   Studies have not yet been done.  Noted bradycardia felt to be related to lithium usage.  Has acutely had worsening of chest pain with past 24 hours.  Noted baseline heavy labor use associated with work.  Pain seems to be worse with work. Presented to the ER afebrile, heart rate 40s to 80s.  BP stable.  Satting well on room air.  White count 5, hemoglobin 15.3, platelets 207, troponin negative x 2.  Lithium level 0.11, glucose 1.05.  Chest x-ray stable.  Review of Systems: As mentioned in the history of present illness. All other systems reviewed and are negative. Past Medical History:  Diagnosis Date   Bipolar and related disorder (HCC)    Bipolar disorder (HCC)    Hypokalemia 05/18/2022   Nausea & vomiting 05/17/2022   Past Surgical History:  Procedure Laterality Date   HERNIA REPAIR     Social History:  reports that he has never smoked. He has never used smokeless tobacco. He reports that he does not drink alcohol and does not use drugs.  No Known Allergies  Family History  Problem Relation Age of Onset   Diabetes Mother    Diabetes Father     Prior to Admission medications   Medication Sig Start Date End Date Taking? Authorizing Provider  buPROPion (WELLBUTRIN XL) 150 MG 24 hr tablet Take 150 mg by mouth daily.   Yes [provider]  clonazePAM (KLONOPIN) 0.5 MG tablet Take 0.5 mg by mouth 2 (two) times daily as needed for anxiety. 12/23/12  Yes [provider]  diphenhydrAMINE (BENADRYL ALLERGY) 25 MG tablet Take 1 tablet (25 mg total) by mouth every 6 (six)  hours as needed (nausea or sleep). 05/19/22  Yes Esaw Grandchild A, DO  divalproex (DEPAKOTE ER) 500 MG 24 hr tablet Take 500 mg by mouth at bedtime. 08/24/23  Yes [provider]  finasteride (PROSCAR) 5 MG tablet Take 5 mg by mouth daily.   Yes [provider]  ibuprofen (ADVIL) 800 MG tablet Take 800 mg by mouth every 8 (eight) hours as needed for mild pain (pain score 1-3). 03/01/22  Yes  [provider]  lithium carbonate (LITHOBID) 300 MG CR tablet Take 300 mg by mouth at bedtime.   Yes [provider]  nitroGLYCERIN (NITROSTAT) 0.4 MG SL tablet Place 0.4 mg under the tongue every 5 (five) minutes as needed for chest pain. 08/20/23 08/19/24 Yes [provider]  pantoprazole (PROTONIX) 40 MG tablet Take 40 mg by mouth daily. 08/01/23  Yes [provider]  tamsulosin (FLOMAX) 0.4 MG CAPS capsule Take 0.4 mg by mouth daily.   Yes [provider]  traZODone (DESYREL) 50 MG tablet Take 2 tablets by mouth at bedtime.   Yes [provider]  cyanocobalamin (VITAMIN B12) 1000 MCG tablet Take 1,000 mcg by mouth daily. Patient not taking: Reported on 09/10/2023 09/01/23 08/31/24  [provider]  lithium carbonate (ESKALITH) 450 MG CR tablet Take 450 mg by mouth at bedtime. Patient not taking: Reported on 09/10/2023 04/19/21   [provider]    Physical Exam: Vitals:   09/10/23 1000 09/10/23 1030 09/10/23 1100 09/10/23 1130  BP: 139/83 129/82 116/75 (!) 148/90  Pulse: (!) 50 (!) 48 (!) 48 (!) 49  Resp: 16 14 16 16   Temp:      TempSrc:      SpO2: 97% 96% 95% 97%  Weight:      Height:       Physical Exam Constitutional:      Appearance: He is normal weight.  HENT:     Head: Normocephalic and atraumatic.     Nose: Nose normal.     Mouth/Throat:     Mouth: Mucous membranes are moist.  Eyes:     Pupils: Pupils are equal, round, and reactive to light.  Cardiovascular:     Rate and Rhythm: Normal rate and regular rhythm.  Pulmonary:     Effort: Pulmonary effort is normal.  Abdominal:     General: Bowel sounds are normal.  Musculoskeletal:     Cervical back: Normal range of motion.     Comments: + anterior chest wall ttp    Skin:    General: Skin is warm.  Neurological:     General: No focal deficit present.  Psychiatric:        Mood and Affect: Mood normal.     Data Reviewed:   There are no new  results to review at this time.  DG Chest 2 View CLINICAL DATA:  Chest pain.  EXAM: CHEST - 2 VIEW  COMPARISON:  07/14/2023.  FINDINGS: Bilateral lung fields are clear. Bilateral costophrenic angles are clear.  Normal cardio-mediastinal silhouette.  No acute osseous abnormalities.  The soft tissues are within normal limits.  IMPRESSION: No active cardiopulmonary disease.  Electronically Signed   By: Jules Schick M.D.   On: 09/10/2023 10:48  Lab Results  Component Value Date   WBC 5.0 09/10/2023   HGB 15.3 09/10/2023   HCT 44.2 09/10/2023   MCV 95.7 09/10/2023   PLT 207 09/10/2023   Last metabolic panel Lab Results  Component Value Date   GLUCOSE 111 (  H) 09/10/2023   NA 139 09/10/2023   K 3.8 09/10/2023   CL 104 09/10/2023   CO2 28 09/10/2023   BUN 10 09/10/2023   CREATININE 1.05 09/10/2023   GFRNONAA >60 09/10/2023   CALCIUM 9.0 09/10/2023   PROT 6.7 05/19/2022   ALBUMIN 3.6 05/19/2022   BILITOT 0.9 05/19/2022   ALKPHOS 69 05/19/2022   AST 34 05/19/2022   ALT 33 05/19/2022   ANIONGAP 7 09/10/2023      Family Communication: Wife at the bedside  Primary team communication: Case discussed w/ ED doc  Thank you very much for involving [place] in the care of your patient.  Author: Floydene Flock, MD 09/10/2023 1:26 PM  For on call review [email or web address].

## 2023-09-10 NOTE — ED Notes (Signed)
 MD at bedside to address pt concerns prior to d/c.

## 2023-09-10 NOTE — Assessment & Plan Note (Signed)
 Heart rate 40s to 80s Felt to be associated with lithium use per cardiology documentation Will otherwise monitor Follow cardiology recommendations

## 2023-09-10 NOTE — ED Triage Notes (Signed)
 Patient states left sided chest pain and shortness of breath since yesterday; reports a near syncopal episode yesterday while working.

## 2023-09-10 NOTE — Consult Note (Signed)
 [facility] CLINIC CARDIOLOGY CONSULT NOTE       Patient ID: Andrew Mcclure MRN: [id number] DOB/AGE: 09/30/1960 63 y.o.  Admit date: 09/10/2023 Referring Physician Dr. Donna Bernard Primary Physician Judie Bonus, MD  Primary Cardiologist Dr. Lourdes Sledge [facility]) Reason for Consultation chest pain, bradycardia  HPI: Andrew Mcclure is a 63 y.o. male  with a past medical history of bipolar disorder, hyperlipidemia, prior polysubstance abuse who presented to the ED on 09/10/2023 for chest pain and shortness of breath. Cardiology was consulted for further evaluation.   Patient present with complaints of chest pain which has been ongoing for multiple weeks. Described as waxing/waning sharp and dull pain. Occurs in a cyclical manner. Symptoms worse yesterday and today so he decided to come to the ED for evaluation. Workup in the ED notable for creatinine 1.05, potassium 3.8, hemoglobin 15.3, WBC 5.0, troponins 6 > 6.  EKG demonstrates sinus bradycardia with a rate of 49 bpm, no acute ST-T changes.  Chest x-ray without acute abnormality.   At the time of my evaluation this afternoon he is resting comfortably in bed with wife present at bedside. Discussed his symptoms in further detail. Also endorses SOB. States his legs feel heavy and he has had some LE edema. Endorses headache. Denies any lightheadedness/dizziness/syncope. Has hx of bradycardia, is not symptomatic with this.   Review of systems complete and found to be negative unless listed above    Past Medical History:  Diagnosis Date   Bipolar and related disorder (HCC)    Bipolar disorder (HCC)    Hypokalemia 05/18/2022   Nausea & vomiting 05/17/2022    Past Surgical History:  Procedure Laterality Date   HERNIA REPAIR      (Not in a hospital admission)  Social History   Socioeconomic History   Marital status: Married    Spouse name: Not on file   Number of children: Not on file   Years of education: Not on file    Highest education level: Not on file  Occupational History   Not on file  Tobacco Use   Smoking status: Never   Smokeless tobacco: Never  Substance and Sexual Activity   Alcohol use: No   Drug use: Never   Sexual activity: Not on file  Other Topics Concern   Not on file  Social History Narrative   Not on file   Social Drivers of Health   Financial Resource Strain: Low Risk  (05/26/2023)   Received from [facility]   Overall Financial Resource Strain (CARDIA)    Difficulty of Paying Living Expenses: Not hard at all  Food Insecurity: No Food Insecurity (05/26/2023)   Received from [facility]   Hunger Vital Sign    Worried About Running Out of Food in the Last Year: Never true    Ran Out of Food in the Last Year: Never true  Transportation Needs: No Transportation Needs (05/26/2023)   Received from [facility] - Transportation    In the past 12 months, has lack of transportation kept you from medical appointments or from getting medications?: No    Lack of Transportation (Non-Medical): No  Physical Activity: Not on file  Stress: Not on file  Social Connections: Not on file  Intimate Partner Violence: Not on file    Family History  Problem Relation Age of Onset   Diabetes Mother    Diabetes Father      Vitals:   09/10/23 [phone]  09/10/23 1000 09/10/23 1030 09/10/23 1100  BP:  139/83 129/82 116/75  Pulse: (!) 49 (!) 50 (!) 48 (!) 48  Resp: 15 16 14 16   Temp:      TempSrc:      SpO2: 97% 97% 96% 95%  Weight:      Height:        PHYSICAL EXAM General: Well appearing male, well nourished, in no acute distress. HEENT: Normocephalic and atraumatic. Neck: No JVD.  Lungs: Normal respiratory effort on room air. Clear bilaterally to auscultation. No wheezes, crackles, rhonchi.  Heart: HRRR. Normal S1 and S2 without gallops or murmurs.  Abdomen: Non-distended appearing.  Msk: Normal strength and tone for  age. Extremities: Warm and well perfused. No clubbing, cyanosis. No edema.  Neuro: Alert and oriented X 3. Psych: Answers questions appropriately.   Labs: Basic Metabolic Panel: Recent Labs    09/10/23 0909  NA 139  K 3.8  CL 104  CO2 28  GLUCOSE 111*  BUN 10  CREATININE 1.05  CALCIUM 9.0   Liver Function Tests: No results for input(s): "AST", "ALT", "ALKPHOS", "BILITOT", "PROT", "ALBUMIN" in the last 72 hours. No results for input(s): "LIPASE", "AMYLASE" in the last 72 hours. CBC: Recent Labs    09/10/23 0909  WBC 5.0  HGB 15.3  HCT 44.2  MCV 95.7  PLT 207   Cardiac Enzymes: Recent Labs    09/10/23 0909  TROPONINIHS 6   BNP: No results for input(s): "BNP" in the last 72 hours. D-Dimer: No results for input(s): "DDIMER" in the last 72 hours. Hemoglobin A1C: No results for input(s): "HGBA1C" in the last 72 hours. Fasting Lipid Panel: No results for input(s): "CHOL", "HDL", "LDLCALC", "TRIG", "CHOLHDL", "LDLDIRECT" in the last 72 hours. Thyroid Function Tests: No results for input(s): "TSH", "T4TOTAL", "T3FREE", "THYROIDAB" in the last 72 hours.  Invalid input(s): "FREET3" Anemia Panel: No results for input(s): "VITAMINB12", "FOLATE", "FERRITIN", "TIBC", "IRON", "RETICCTPCT" in the last 72 hours.   Radiology: DG Chest 2 View Result Date: 09/10/2023 CLINICAL DATA:  Chest pain. EXAM: CHEST - 2 VIEW COMPARISON:  07/14/2023. FINDINGS: Bilateral lung fields are clear. Bilateral costophrenic angles are clear. Normal cardio-mediastinal silhouette. No acute osseous abnormalities. The soft tissues are within normal limits. IMPRESSION: No active cardiopulmonary disease. Electronically Signed   By: Jules Schick M.D.   On: 09/10/2023 10:48    ECHO 08/2023: NORMAL LEFT VENTRICULAR SYSTOLIC FUNCTION WITH NO LVH  ESTIMATED EF: >55%, CALC EF(3D): 57%  NORMAL LA PRESSURES WITH NORMAL DIASTOLIC FUNCTION  NORMAL RIGHT VENTRICULAR SYSTOLIC FUNCTION  VALVULAR REGURGITATION:  MODERATE AR, TRIVIAL MR, No PR, MILD TR  NO VALVULAR STENOSIS   TELEMETRY reviewed by me 09/10/2023: sinus brady rate 40-50s  EKG reviewed by me: Sinus bradycardia rate 49 bpm, no acute ST-T changes  Data reviewed by me 09/10/2023: last 24h vitals tele labs imaging I/O ED provider note, hospitalist note  Active Problems:   * No active hospital problems. *    ASSESSMENT AND PLAN:  Florentino Laabs is a 63 y.o. male  with a past medical history of bipolar disorder, hyperlipidemia, prior polysubstance abuse who presented to the ED on 09/10/2023 for chest pain and shortness of breath. Cardiology was consulted for further evaluation.   # Sinus bradycardia # Atypical chest pain Patient presented with sharp and dull chest discomfort symptoms occurring in a cyclical manner. Troponins normal x2. EKG without acute ischemic changes. No dizziness/lightheadedness or syncope. Bradycardia consistent with recent outpatient monitoring, no evidence of high  grade AVB. Chest tender to palpation on exam. -Proceed with outpatient coronary CTA as scheduled. No availability for this to be done inpatient today due to staffing. -Can use anti-inflammatory medications including tylenol, ibuprofen, voltaren gel to help with costochondritis.  -No plan for further cardiac workup. Continue current outpatient medications.  -Patient ok to be discharged with follow up with outpatient cardiology.    This patient's plan of care was discussed and created with Dr. Melton Alar and she is in agreement.  Signed: Gale Journey, PA-C  09/10/2023, 11:32 AM [facility] Cardiology

## 2023-09-10 NOTE — ED Notes (Signed)
 Pt and wife state they have no further questions at this time. All questions and concerns were addressed by MD per pt and wife. Pt denies pain at d/c.

## 2023-09-10 NOTE — Assessment & Plan Note (Signed)
 Stable at present  On lithium

## 2023-09-10 NOTE — ED Notes (Signed)
 MD made aware pt having increased chest pain when HR decreased to 48 bpm.

## 2023-09-10 NOTE — ED Notes (Signed)
 Patient transported to X-ray

## 2023-09-10 NOTE — Assessment & Plan Note (Addendum)
 Costochondritis Recurrence of central chest pain with moderate intensity with some ? radiation of the neck and down the left arm x 3 to 4 weeks Pain seems to spike while associated with work activities-noted baseline heavy labor Marked anterior chest wall tenderness palpation exam concerning for costochondritis Currently also seeing outpatient cardiology for further evaluation in setting of bradycardia Status post formal evaluation by cardiology today with recommendation for outpatient coronary CT Troponin negative x 2 EKG within normal limits Plan for NSAIDs as well as overall rest Will otherwise follow-up cardiology recommendations
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
## 2024-03-15 NOTE — Telephone Encounter (Signed)
 No Actions Required FYI ONLY Disposition:Office within 2 weeks Patient Agreed to Disposition       Reason for call: Patient and Spouse is calling for:  Chief Complaint  Patient presents with  . Hypertension     Pt and pt's wife reports HTN.  BP was 168/86, HR 53 today BP has been similar for the 3 three days.   He endorses that he has a headache and left arm pain. The left arm feels like a cramp.   Pt is not on any blood pressure medicine.    Actions taken during call: Scheduled an Appointment.  Future Appointments     Date/Time Provider Department Center Visit Type   03/16/2024 3:30 PM (Arrive by 3:15 PM) Allana Sizer, NP [facility] SAME DAY   03/23/2024 11:40 AM (Arrive by 11:25 AM) Tomi Carder, MD [facility]       Triage: Triage completed, care advice given per protocol. Triage:Call back parameters given and caller advised of 24 hour nurse triage. Instructed to seek immediate medical attention if new symptoms develop, current symptoms worsen or if you become increasingly concerned. Patient verbalized understanding.     AVELINA HAZY, RN [facility] Patient Engagement Center  Patient Engagement Center Documentation    Reason for Disposition . BP > 160/100  Additional Information . Negative: Sounds like a life-threatening emergency to the triager . Negative: Pregnant and new hand or face swelling . Negative: Pregnant > 20 weeks and BP > 140/90 . Negative: BP > 160 / 100 and any cardiac or neurologic symptoms (e.g., chest pain, difficulty breathing, unsteady gait, blurred vision) . Negative: Patient sounds very sick or weak to the triager . Negative: BP = 180/110 and missed most recent dose of blood pressure medication . Negative: BP > 180/110 . Negative: Patient wants to be seen . Negative: Ran out of BP medications . Negative: Taking BP medications and feels is having side  effects (e.g., impotence, cough, dizziness)  Protocols used: High Blood Pressure-A-OH

## 2024-03-17 ENCOUNTER — Encounter: Payer: Self-pay | Admitting: *Deleted

## 2024-03-17 ENCOUNTER — Other Ambulatory Visit: Payer: Self-pay

## 2024-03-17 DIAGNOSIS — K625 Hemorrhage of anus and rectum: Secondary | ICD-10-CM | POA: Insufficient documentation

## 2024-03-17 NOTE — ED Triage Notes (Signed)
 Pt ambulatory to triage.  Pt has abrasion to right testicle.  Sx started 30 minutes ago.  No bleeding at [facility] time.  No urinary sx.  No testicle swelling.  Pt alert

## 2024-03-18 ENCOUNTER — Emergency Department
Admission: EM | Admit: 2024-03-18 | Discharge: 2024-03-18 | Disposition: A | Discharge status: Home / Self Care | Attending: Emergency Medicine | Admitting: Emergency Medicine

## 2024-03-18 DIAGNOSIS — K625 Hemorrhage of anus and rectum: Secondary | ICD-10-CM

## 2024-03-18 NOTE — ED Provider Notes (Signed)
 [facility] Provider Note    Event Date/Time   First MD Initiated Contact with Patient 03/18/24 [phone]     (approximate)   History   Abrasion   HPI  Aloysious Vangieson is a 63 y.o. male   Past medical history of no significant past medical history presents emergency department with blood in his scrotum.  He is unsure where the blood came from.  He reports no injuries on his scrotum.  He reports having an episode of diarrhea earlier today without abdominal pain, and then while showering he noticed blood dripping down his scrotum.  He reports no pain in his testicles.  He reports urinating since that episode and seeing no pink-tinged urine or blood from his penis.  He reports no history of rectal bleeding.  He reports no injuries to the area.  He reports no pain currently.  He is not on blood thinners.  He had a colonoscopy which was largely unremarkable earlier this year.  Independent Historian contributed to assessment above: Wife corroborates information above.  External Medical Documents Reviewed: Colonoscopy from earlier this year which had inadequate prep but under those circumstances no acute abnormalities noted on report      Physical Exam   Triage Vital Signs: ED Triage Vitals  Encounter Vitals Group     BP 03/17/24 2235 (!) 175/91     Girls Systolic BP Percentile --      Girls Diastolic BP Percentile --      Boys Systolic BP Percentile --      Boys Diastolic BP Percentile --      Pulse Rate 03/17/24 2235 69     Resp 03/17/24 2235 20     Temp 03/17/24 2235 98.3 F (36.8 C)     Temp Source 03/17/24 2235 Oral     SpO2 03/17/24 2235 95 %     Weight 03/17/24 2234 195 lb (88.5 kg)     Height 03/17/24 2234 5' 9 (1.753 m)     Head Circumference --      Peak Flow --      Pain Score 03/17/24 2234 5     Pain Loc --      Pain Education --      Exclude from Growth Chart --     Most recent vital signs: Vitals:   03/17/24 2235  BP: (!) 175/91   Pulse: 69  Resp: 20  Temp: 98.3 F (36.8 C)  SpO2: 95%    General: Awake, no distress.  CV:  Good peripheral perfusion.  Resp:  Normal effort.  Abd:  No distention.  Other:  Well-appearing gentleman in no acute distress.  There is a very small dried piece of blood on his scrotum which I wiped away and there was no underlying lesions.  No skin changes.  No testicular pain.  I examined the peritoneum and there was no skin changes there is no crepitus or pain out of proportion and rectal exam without hemorrhoids or blood.   ED Results / Procedures / Treatments   Labs (all labs ordered are listed, but only abnormal results are displayed) Labs Reviewed - No data to display    PROCEDURES:  Critical Care performed: No  Procedures   MEDICATIONS ORDERED IN ED: Medications - No data to display  IMPRESSION / MDM / ASSESSMENT AND PLAN / ED COURSE  I reviewed the triage vital signs and the nursing notes.  Patient's presentation is most consistent with acute complicated illness / injury requiring diagnostic workup.  Differential diagnosis includes, but is not limited to, abrasion, laceration, infection, rectal bleed, hematuria, considered but less likely acute blood loss anemia or life-threatening amount of blood loss.    MDM:    Not certain where the blood came from but my high suspicion is a rectal bleed.  He did have a diarrheal episode earlier today and then some blood in his peritoneum dripping but no obvious lesions to the scrotum or perineum, and he did have a further episode of bowel movement without any blood at all.  He also urinated without any blood.  He denies any acute injuries.  Not sure where the blood came from.  The rectal exam was normal.  He is not on blood thinners and there is no evidence of acute blood loss with normal vital signs and no ongoing bleeding.  He had a soft nontender abdomen exam and so I doubt there is a significant  abdominal infection or ongoing bleeding.  We talked about blood testing and CT imaging but he wants to defer at this time and follow-up with his PMD and GI doctor.  I think this is reasonable.  Discharge.      FINAL CLINICAL IMPRESSION(S) / ED DIAGNOSES   Final diagnoses:  Rectal bleed     Rx / DC Orders   ED Discharge Orders     None        Note:  This document was prepared using Dragon voice recognition software and may include unintentional dictation errors.    Cyrena Mylar, MD 03/18/24 [phone]

## 2024-03-18 NOTE — Discharge Instructions (Signed)
 I think the blood that you found in your scrotum and in your perineal area was likely due to a resolved rectal bleed.  This could be due to hemorrhoids, or colon bleeding.  Since he had another bowel movement and did not see any blood, nor did we see any cuts or scrapes on the outside, nor did you see any blood in your urine, we could not identify an obvious source of the blood today.  We talked about checking blood tests or CT scans, and decided that it was unlikely to be useful given that the bleeding had stopped and you are otherwise without symptoms.  Please call your doctor and your gastroenterologist to let them know about the occurrence today and for a checkup or another colonoscopy as needed.  Thank you for choosing us  for your health care today!  Please see your primary doctor this week for a follow up appointment.   If you have any new, worsening, or unexpected symptoms call your doctor right away or come back to the emergency department for reevaluation.  It was my pleasure to care for you today.   Ginnie EDISON Cyrena, MD

## 2024-05-22 ENCOUNTER — Emergency Department

## 2024-05-22 ENCOUNTER — Inpatient Hospital Stay
Admission: EM | Admit: 2024-05-22 | Discharge: 2024-05-25 | DRG: 390 | Disposition: A | Discharge status: Home / Self Care | Attending: Surgery | Admitting: Surgery

## 2024-05-22 ENCOUNTER — Inpatient Hospital Stay

## 2024-05-22 ENCOUNTER — Encounter: Payer: Self-pay | Admitting: Intensive Care

## 2024-05-22 ENCOUNTER — Other Ambulatory Visit: Payer: Self-pay

## 2024-05-22 DIAGNOSIS — Z8616 Personal history of COVID-19: Secondary | ICD-10-CM | POA: Diagnosis not present

## 2024-05-22 DIAGNOSIS — Z833 Family history of diabetes mellitus: Secondary | ICD-10-CM | POA: Diagnosis not present

## 2024-05-22 DIAGNOSIS — K56609 Unspecified intestinal obstruction, unspecified as to partial versus complete obstruction: Principal | ICD-10-CM | POA: Diagnosis present

## 2024-05-22 DIAGNOSIS — D72829 Elevated white blood cell count, unspecified: Secondary | ICD-10-CM | POA: Diagnosis present

## 2024-05-22 DIAGNOSIS — I10 Essential (primary) hypertension: Secondary | ICD-10-CM | POA: Diagnosis present

## 2024-05-22 DIAGNOSIS — E7800 Pure hypercholesterolemia, unspecified: Secondary | ICD-10-CM | POA: Diagnosis present

## 2024-05-22 DIAGNOSIS — F319 Bipolar disorder, unspecified: Secondary | ICD-10-CM | POA: Diagnosis present

## 2024-05-22 DIAGNOSIS — Z79899 Other long term (current) drug therapy: Secondary | ICD-10-CM | POA: Diagnosis not present

## 2024-05-22 DIAGNOSIS — K56600 Partial intestinal obstruction, unspecified as to cause: Principal | ICD-10-CM | POA: Diagnosis present

## 2024-05-22 DIAGNOSIS — N400 Enlarged prostate without lower urinary tract symptoms: Secondary | ICD-10-CM | POA: Diagnosis present

## 2024-05-22 HISTORY — DX: Essential (primary) hypertension: I10

## 2024-05-22 HISTORY — DX: Pure hypercholesterolemia, unspecified: E78.00

## 2024-05-22 LAB — CBC
HCT: 45 % (ref 39.0–52.0)
Hemoglobin: 15.5 g/dL (ref 13.0–17.0)
MCH: 32.7 pg (ref 26.0–34.0)
MCHC: 34.4 g/dL (ref 30.0–36.0)
MCV: 94.9 fL (ref 80.0–100.0)
Platelets: 241 K/uL (ref 150–400)
RBC: 4.74 MIL/uL (ref 4.22–5.81)
RDW: 13.3 % (ref 11.5–15.5)
WBC: 9 K/uL (ref 4.0–10.5)
nRBC: 0 % (ref 0.0–0.2)

## 2024-05-22 LAB — COMPREHENSIVE METABOLIC PANEL WITH GFR
ALT: 25 U/L (ref 0–44)
AST: 35 U/L (ref 15–41)
Albumin: 4.3 g/dL (ref 3.5–5.0)
Alkaline Phosphatase: 93 U/L (ref 38–126)
Anion gap: 11 (ref 5–15)
BUN: 11 mg/dL (ref 8–23)
CO2: 21 mmol/L — ABNORMAL LOW (ref 22–32)
Calcium: 9.1 mg/dL (ref 8.9–10.3)
Chloride: 108 mmol/L (ref 98–111)
Creatinine, Ser: 0.9 mg/dL (ref 0.61–1.24)
GFR, Estimated: >60 mL/min (ref >60)
Glucose, Bld: 125 mg/dL — ABNORMAL HIGH (ref 70–99)
Potassium: 4 mmol/L (ref 3.5–5.1)
Sodium: 139 mmol/L (ref 135–145)
Total Bilirubin: 0.6 mg/dL (ref 0.0–1.2)
Total Protein: 7.2 g/dL (ref 6.5–8.1)

## 2024-05-22 LAB — URINALYSIS, ROUTINE W REFLEX MICROSCOPIC
Bacteria, UA: NONE SEEN
Bilirubin Urine: NEGATIVE
Glucose, UA: NEGATIVE mg/dL
Ketones, ur: NEGATIVE mg/dL
Leukocytes,Ua: NEGATIVE
Nitrite: NEGATIVE
Protein, ur: NEGATIVE mg/dL
Specific Gravity, Urine: 1.021 (ref 1.005–1.030)
Squamous Epithelial / HPF: 0 /HPF (ref 0–5)
pH: 5 (ref 5.0–8.0)

## 2024-05-22 LAB — LIPASE, BLOOD: Lipase: 29 U/L (ref 11–51)

## 2024-05-22 MED ORDER — HYDROCODONE-ACETAMINOPHEN 5-325 MG PO TABS: 1.0000 | ORAL_TABLET | Freq: Three times a day (TID) | ORAL | Status: DC | PRN

## 2024-05-22 MED ORDER — ACETAMINOPHEN 325 MG PO TABS: 650.0000 mg | ORAL_TABLET | Freq: Three times a day (TID) | ORAL | Status: DC | PRN

## 2024-05-22 MED ORDER — MORPHINE SULFATE (PF) 2 MG/ML IV SOLN
2.0000 mg | INTRAVENOUS | Status: DC | PRN
  Administered 2024-05-22 – 2024-05-23 (×2): 2 mg via INTRAVENOUS
  Filled 2024-05-22 (×2): qty 1

## 2024-05-22 MED ORDER — PANTOPRAZOLE SODIUM 40 MG PO TBEC
40.0000 mg | DELAYED_RELEASE_TABLET | Freq: Every day | ORAL | Status: DC
Start: 2024-05-23 — End: 2024-05-23

## 2024-05-22 MED ORDER — DOCUSATE SODIUM 100 MG PO CAPS: 100.0000 mg | ORAL_CAPSULE | Freq: Two times a day (BID) | ORAL | Status: DC | PRN

## 2024-05-22 MED ORDER — IOHEXOL 300 MG/ML  SOLN
100.0000 mL | Freq: Once | INTRAMUSCULAR | Status: AC | PRN
  Administered 2024-05-22: 100 mL via INTRAVENOUS

## 2024-05-22 MED ORDER — LITHIUM CARBONATE ER 300 MG PO TBCR
300.0000 mg | EXTENDED_RELEASE_TABLET | Freq: Two times a day (BID) | ORAL | Status: DC
  Administered 2024-05-23: 300 mg via ORAL
  Filled 2024-05-22 (×2): qty 1

## 2024-05-22 MED ORDER — SODIUM CHLORIDE 0.9 % IV BOLUS
1000.0000 mL | Freq: Once | INTRAVENOUS | Status: AC
  Administered 2024-05-22: 1000 mL via INTRAVENOUS

## 2024-05-22 MED ORDER — GABAPENTIN 300 MG PO CAPS
300.0000 mg | ORAL_CAPSULE | Freq: Every day | ORAL | Status: DC
  Filled 2024-05-22: qty 1

## 2024-05-22 MED ORDER — SODIUM CHLORIDE 0.9 % IV SOLN: INTRAVENOUS | Status: DC

## 2024-05-22 MED ORDER — CLONAZEPAM 0.5 MG PO TABS: 0.5000 mg | ORAL_TABLET | Freq: Two times a day (BID) | ORAL | Status: DC | PRN

## 2024-05-22 MED ORDER — BUPROPION HCL ER (XL) 150 MG PO TB24: 150.0000 mg | ORAL_TABLET | Freq: Every day | ORAL | Status: DC

## 2024-05-22 MED ORDER — ENOXAPARIN SODIUM 40 MG/0.4ML IJ SOSY
40.0000 mg | PREFILLED_SYRINGE | INTRAMUSCULAR | Status: DC
  Administered 2024-05-23 – 2024-05-25 (×3): 40 mg via SUBCUTANEOUS
  Filled 2024-05-22 (×3): qty 0.4

## 2024-05-22 MED ORDER — LIDOCAINE HCL URETHRAL/MUCOSAL 2 % EX GEL
1.0000 | Freq: Once | CUTANEOUS | Status: AC
  Administered 2024-05-22: 1

## 2024-05-22 MED ORDER — DIATRIZOATE MEGLUMINE & SODIUM 66-10 % PO SOLN
90.0000 mL | Freq: Once | ORAL | Status: AC
  Administered 2024-05-22: 90 mL via NASOGASTRIC

## 2024-05-22 MED ORDER — AMLODIPINE BESYLATE 5 MG PO TABS: 5.0000 mg | ORAL_TABLET | Freq: Every day | ORAL | Status: DC

## 2024-05-22 MED ORDER — FINASTERIDE 5 MG PO TABS
5.0000 mg | ORAL_TABLET | Freq: Every day | ORAL | Status: DC
  Filled 2024-05-22: qty 1

## 2024-05-22 NOTE — ED Triage Notes (Signed)
 Patient reports abdominal pain and bloating X2 days. Reports slight headache. Sent from [facility]

## 2024-05-22 NOTE — H&P (Signed)
 Subjective:   CC: SBO  HPI:  Andrew Mcclure is a 63 y.o. male who is consulted by [facility] for evaluation of above cc.  Symptoms were first noted 2 days ago. Pain is sharp, epigastric.  Associated with nausea vomiting constipation, exacerbated by nothing specific     Past Medical History:  has a past medical history of Bipolar and related disorder (HCC), Bipolar disorder (HCC), High cholesterol, Hypertension, Hypokalemia (05/18/2022), and Nausea & vomiting (05/17/2022).  Past Surgical History:  has a past surgical history that includes Hernia repair.  Hiatal, laparoscopic  Family History: family history includes Diabetes in his father and mother.  Social History:  reports that he has never smoked. He has never used smokeless tobacco. He reports that he does not drink alcohol and does not use drugs.  Current Medications:  Prior to Admission medications   Medication Sig Start Date End Date Taking? Authorizing Provider  amLODipine  (NORVASC ) 5 MG tablet Take 5 mg by mouth daily.   Yes [provider]  buPROPion  (WELLBUTRIN  XL) 150 MG 24 hr tablet Take 150 mg by mouth daily.   Yes [provider]  clonazePAM  (KLONOPIN ) 0.5 MG tablet Take 0.5 mg by mouth 2 (two) times daily as needed for anxiety. 12/23/12  Yes [provider]  finasteride  (PROSCAR ) 5 MG tablet Take 5 mg by mouth daily.   Yes [provider]  gabapentin  (NEURONTIN ) 300 MG capsule Take 300 mg by mouth at bedtime.   Yes [provider]  lithium  carbonate (LITHOBID ) 300 MG CR tablet Take 300 mg by mouth 2 (two) times daily.   Yes [provider]  pantoprazole  (PROTONIX ) 40 MG tablet Take 40 mg by mouth daily. 08/01/23  Yes [provider]  rosuvastatin (CRESTOR) 10 MG tablet Take 10 mg by mouth daily.   Yes [provider]  nitroGLYCERIN (NITROSTAT) 0.4 MG SL tablet Place 0.4 mg under the tongue every 5 (five) minutes as needed for chest pain. 08/20/23 08/19/24   [provider]    Allergies:  Allergies as of 05/22/2024   (No Known Allergies)    ROS:  Pertinent positives and negatives noted in HPI   Objective:     BP (!) 124/56 (BP Location: Left Arm)   Pulse 72   Temp 98.1 F (36.7 C) (Oral)   Resp 16   Ht 5' 9 (1.753 m)   Wt 85.7 kg   SpO2 98%   BMI 27.91 kg/m    Constitutional :  alert, cooperative, appears stated age, and no distress  Respiratory:  Clear to auscultation bilaterally  Cardiovascular:  Regular rate and rhythm  Gastrointestinal: Soft, no guarding, distention noted with some tenderness to palpation epigastric area.   Skin: Cool and moist  Psychiatric: Normal affect, non-agitated, not confused       LABS:     Latest Ref Rng & Units 05/22/2024    9:34 AM 09/10/2023    9:09 AM 07/14/2023    4:13 PM  CMP  Glucose 70 - 99 mg/dL 874  888  98   BUN 8 - 23 mg/dL 11  10  8    Creatinine 0.61 - 1.24 mg/dL 9.09  8.94  8.77   Sodium 135 - 145 mmol/L 139  139  139   Potassium 3.5 - 5.1 mmol/L 4.0  3.8  3.7   Chloride 98 - 111 mmol/L 108  104  104   CO2 22 - 32 mmol/L 21  28  23    Calcium 8.9 -  10.3 mg/dL 9.1  9.0  9.0   Total Protein 6.5 - 8.1 g/dL 7.2     Total Bilirubin 0.0 - 1.2 mg/dL 0.6     Alkaline Phos 38 - 126 U/L 93     AST 15 - 41 U/L 35     ALT 0 - 44 U/L 25         Latest Ref Rng & Units 05/22/2024    9:34 AM 09/10/2023    9:09 AM 07/14/2023    4:13 PM  CBC  WBC 4.0 - 10.5 K/uL 9.0  5.0  6.4   Hemoglobin 13.0 - 17.0 g/dL 84.4  84.6  84.8   Hematocrit 39.0 - 52.0 % 45.0  44.2  45.2   Platelets 150 - 400 K/uL 241  207  230      RADS: EXAM: CT ABDOMEN AND PELVIS WITH CONTRAST 05/22/2024 12:42:44 PM   TECHNIQUE: CT of the abdomen and pelvis was performed with the administration of 100 mL of iohexol  (OMNIPAQUE ) 300 MG/ML solution. Multiplanar reformatted images are provided for review. Automated exposure control, iterative reconstruction, and/or weight-based adjustment of the mA/kV  was utilized to reduce the radiation dose to as low as reasonably achievable.   COMPARISON: 06/10/2023   CLINICAL HISTORY: Abdominal pain, acute, nonlocalized.   FINDINGS:   LOWER CHEST: Small hiatal hernia.   LIVER: The liver is unremarkable.   GALLBLADDER AND BILE DUCTS: Gallbladder is unremarkable. No biliary ductal dilatation.   SPLEEN: The spleen is within normal limits in size and appearance.   PANCREAS: Pancreas is normal in size and contour without focal lesion or ductal dilatation.   ADRENAL GLANDS: Normal size and morphology bilaterally. No nodule, thickening, or hemorrhage. No periadrenal stranding.   KIDNEYS, URETERS AND BLADDER: No stones in the kidneys or ureters. No hydronephrosis. No perinephric or periureteral stranding. Multiple bladder diverticula are identified, the largest has a wide mouth opening and arises off the left posterior bladder wall measuring 4.9 cm.   GI AND BOWEL: Stomach demonstrates no acute abnormality. Abnormal dilation of the proximal and mid small bowel loops with air-fluid levels. The small bowel measures up to 4.2 cm. Transition to small bowel of decreased caliber distal small bowel just before the terminal ileum is identified within the right hemiabdomen, image 39/2. Colonic diverticula noted without signs of acute diverticulitis. There is no bowel obstruction.   PERITONEUM AND RETROPERITONEUM: Trace free fluid noted within the right iliac fossa, image 68/2. No free air. No signs of pneumoperitoneum.   VASCULATURE: Aorta is normal in caliber. Aortic atherosclerotic calcifications.   LYMPH NODES: No lymphadenopathy.   REPRODUCTIVE ORGANS: The prostate gland is unremarkable.   BONES AND SOFT TISSUES: Bilateral fat-containing inguinal hernias. No acute osseous abnormality. No focal soft tissue abnormality.   IMPRESSION: 1. Abnormal dilation of the proximal and mid small bowel loops with air-fluid levels, measuring  up to 4.2 cm, with a transition to decompressed distal small bowel just proximal to the terminal ileum in the right hemiabdomen, consistent with small bowel obstruction. .   Electronically signed by: Waddell Calk MD 05/22/2024 12:53 PM EST RP Workstation: [id number]  Assessment:   SBO  Plan:     Discussed pathophisiology and treatment options including NG tube decompression and possible surgery for lysis of adhesions.  Abdominal exam reassuring with no signs of acute peritonitis.  Will start with small bowel follow-through for now after NG tube placement.  Ice chips, IV fluids in the meantime.  The patient verbalized understanding  and all questions were answered to the patient's satisfaction.  labs/images/medications/previous chart entries reviewed personally and relevant changes/updates noted above.

## 2024-05-22 NOTE — ED Provider Notes (Signed)
 [facility] Provider Note    Event Date/Time   First MD Initiated Contact with Patient 05/22/24 1124     (approximate)  History   Chief Complaint: Abdominal Pain  HPI  Andrew Mcclure is a 63 y.o. male with a past medical history of bipolar, hypertension, hyperlipidemia, presents to the emergency department for abdominal discomfort and bloating.  Patient states for the last 2 to 3 days he has had abdominal cramping and distention/bloating.  Patient denies any nausea or vomiting.  Patient states normally he has a bowel movement each day but has not had 1 for 2 to 3 days now.  Denies any fever.  Patient has had a prior hiatal hernia surgery on his abdomen.  Physical Exam   Triage Vital Signs: ED Triage Vitals  Encounter Vitals Group     BP 05/22/24 0931 (!) 124/56     Girls Systolic BP Percentile --      Girls Diastolic BP Percentile --      Boys Systolic BP Percentile --      Boys Diastolic BP Percentile --      Pulse Rate 05/22/24 0931 72     Resp 05/22/24 0931 16     Temp 05/22/24 0931 98.1 F (36.7 C)     Temp Source 05/22/24 0931 Oral     SpO2 05/22/24 0931 98 %     Weight 05/22/24 0932 189 lb (85.7 kg)     Height 05/22/24 0932 5' 9 (1.753 m)     Head Circumference --      Peak Flow --      Pain Score 05/22/24 0932 6     Pain Loc --      Pain Education --      Exclude from Growth Chart --     Most recent vital signs: Vitals:   05/22/24 0931  BP: (!) 124/56  Pulse: 72  Resp: 16  Temp: 98.1 F (36.7 C)  SpO2: 98%    General: Awake, no distress.  CV:  Good peripheral perfusion.  Regular rate and rhythm  Resp:  Normal effort.  Equal breath sounds bilaterally.  Abd:  No distention.  Soft, mild distention, tympanic percussion.  Largely nontender.  ED Results / Procedures / Treatments   RADIOLOGY  I have reviewed interpret the CT images.  Patient appears to have significant intestinal dilation, concerning for SBO versus  ileus. Radiology confirms distended bowel with a transition point concerning for SBO  MEDICATIONS ORDERED IN ED: Medications  sodium chloride  0.9 % bolus 1,000 mL (1,000 mLs Intravenous New Bag/Given 05/22/24 1139)     IMPRESSION / MDM / ASSESSMENT AND PLAN / ED COURSE  I reviewed the triage vital signs and the nursing notes.  Patient's presentation is most consistent with acute presentation with potential threat to life or bodily function.  Patient presents emergency.  Patient's lab work today department for abdominal discomfort and distention ongoing over the last 2 to 3 days.  Patient's lab work today shows a reassuring CBC with a normal white blood cell count reassuring chemistry with normal LFTs normal lipase.  Urinalysis shows no concerning findings.  However given the patient's abdominal distention tympanic percussion we will proceed with a CT scan of the abdomen and pelvis to evaluate for intra-abdominal pathology such as adhesions/obstruction.  Patient agreeable to plan of care.  CT scan has resulted concerning for small bowel obstruction.  I spoke with Dr. Tye of general surgery who will be down to  see the patient and admit to his service for further workup and treatment.  Patient agreeable to plan.  FINAL CLINICAL IMPRESSION(S) / ED DIAGNOSES   Abdominal pain Distention Small bowel obstruction  Note:  This document was prepared using Dragon voice recognition software and may include unintentional dictation errors.   Dorothyann Drivers, MD 05/22/24 [phone]

## 2024-05-22 NOTE — ED Notes (Signed)
 NG tube unclamped and placed on intermittent low suction.

## 2024-05-22 NOTE — ED Notes (Signed)
 Pt hooked up to low intermittent suction at this time.

## 2024-05-23 ENCOUNTER — Inpatient Hospital Stay

## 2024-05-23 ENCOUNTER — Encounter: Payer: Self-pay | Admitting: Surgery

## 2024-05-23 LAB — BASIC METABOLIC PANEL WITH GFR
Anion gap: 10 (ref 5–15)
BUN: 9 mg/dL (ref 8–23)
CO2: 21 mmol/L — ABNORMAL LOW (ref 22–32)
Calcium: 8.8 mg/dL — ABNORMAL LOW (ref 8.9–10.3)
Chloride: 112 mmol/L — ABNORMAL HIGH (ref 98–111)
Creatinine, Ser: 0.79 mg/dL (ref 0.61–1.24)
GFR, Estimated: >60 mL/min (ref >60)
Glucose, Bld: 104 mg/dL — ABNORMAL HIGH (ref 70–99)
Potassium: 4 mmol/L (ref 3.5–5.1)
Sodium: 142 mmol/L (ref 135–145)

## 2024-05-23 LAB — CBC
HCT: 42 % (ref 39.0–52.0)
Hemoglobin: 14.1 g/dL (ref 13.0–17.0)
MCH: 32.5 pg (ref 26.0–34.0)
MCHC: 33.6 g/dL (ref 30.0–36.0)
MCV: 96.8 fL (ref 80.0–100.0)
Platelets: 199 K/uL (ref 150–400)
RBC: 4.34 MIL/uL (ref 4.22–5.81)
RDW: 13.4 % (ref 11.5–15.5)
WBC: 5.2 K/uL (ref 4.0–10.5)
nRBC: 0 % (ref 0.0–0.2)

## 2024-05-23 LAB — HIV ANTIBODY (ROUTINE TESTING W REFLEX): HIV Screen 4th Generation wRfx: NONREACTIVE

## 2024-05-23 MED ORDER — PHENOL 1.4 % MT LIQD
1.0000 | OROMUCOSAL | Status: DC | PRN
  Administered 2024-05-23: 1 via OROMUCOSAL
  Filled 2024-05-23: qty 177

## 2024-05-23 MED ORDER — CLONAZEPAM 0.5 MG PO TABS: 0.5000 mg | ORAL_TABLET | Freq: Two times a day (BID) | ORAL | Status: DC | PRN

## 2024-05-23 MED ORDER — BUPROPION HCL 75 MG PO TABS
75.0000 mg | ORAL_TABLET | Freq: Two times a day (BID) | ORAL | Status: DC
  Administered 2024-05-24 – 2024-05-25 (×3): 75 mg via ORAL
  Filled 2024-05-23 (×4): qty 1

## 2024-05-23 MED ORDER — AMLODIPINE BESYLATE 5 MG PO TABS
5.0000 mg | ORAL_TABLET | Freq: Every day | ORAL | Status: DC
  Administered 2024-05-24 – 2024-05-25 (×2): 5 mg via ORAL
  Filled 2024-05-23 (×2): qty 1

## 2024-05-23 MED ORDER — LITHIUM CARBONATE 300 MG PO CAPS
300.0000 mg | ORAL_CAPSULE | Freq: Two times a day (BID) | ORAL | Status: DC
  Administered 2024-05-24 – 2024-05-25 (×3): 300 mg via ORAL
  Filled 2024-05-23 (×4): qty 1

## 2024-05-23 MED ORDER — DOCUSATE SODIUM 50 MG/5ML PO LIQD: 100.0000 mg | Freq: Two times a day (BID) | ORAL | Status: DC | PRN

## 2024-05-23 MED ORDER — GABAPENTIN 250 MG/5ML PO SOLN: 300.0000 mg | Freq: Every day | ORAL | Status: DC

## 2024-05-23 MED ORDER — AMLODIPINE BESYLATE 5 MG PO TABS: 5.0000 mg | ORAL_TABLET | Freq: Every day | ORAL | Status: DC

## 2024-05-23 MED ORDER — HYDROCODONE-ACETAMINOPHEN 5-325 MG PO TABS: 1.0000 | ORAL_TABLET | Freq: Three times a day (TID) | ORAL | Status: DC | PRN

## 2024-05-23 MED ORDER — GABAPENTIN 250 MG/5ML PO SOLN
300.0000 mg | Freq: Every day | ORAL | Status: DC
  Administered 2024-05-23: 300 mg
  Filled 2024-05-23 (×2): qty 6

## 2024-05-23 MED ORDER — LITHIUM CARBONATE 300 MG PO CAPS
300.0000 mg | ORAL_CAPSULE | Freq: Two times a day (BID) | ORAL | Status: DC
  Administered 2024-05-23 (×2): 300 mg
  Filled 2024-05-23 (×3): qty 1

## 2024-05-23 MED ORDER — GABAPENTIN 250 MG/5ML PO SOLN
300.0000 mg | Freq: Every day | ORAL | Status: DC
  Administered 2024-05-24: 300 mg via ORAL
  Filled 2024-05-23 (×2): qty 6

## 2024-05-23 MED ORDER — BUPROPION HCL 75 MG PO TABS
75.0000 mg | ORAL_TABLET | Freq: Two times a day (BID) | ORAL | Status: DC
  Administered 2024-05-23 (×2): 75 mg
  Filled 2024-05-23 (×3): qty 1

## 2024-05-23 MED ORDER — HYDROCODONE-ACETAMINOPHEN 5-325 MG PO TABS
1.0000 | ORAL_TABLET | Freq: Three times a day (TID) | ORAL | Status: DC | PRN
  Administered 2024-05-24: 1 via ORAL
  Filled 2024-05-23: qty 1

## 2024-05-23 MED ORDER — PANTOPRAZOLE SODIUM 40 MG IV SOLR
40.0000 mg | Freq: Every day | INTRAVENOUS | Status: DC
  Administered 2024-05-23 – 2024-05-25 (×3): 40 mg via INTRAVENOUS
  Filled 2024-05-23 (×3): qty 10

## 2024-05-23 NOTE — ED Notes (Signed)
 Messaged MD due to patient complaining of cough and congestion and it hurting.  Patient reports pills given last night did not go down.  Patient appears anxious and agitated.

## 2024-05-23 NOTE — ED Notes (Signed)
 Patient had small BM just now.

## 2024-05-23 NOTE — ED Notes (Signed)
 Spoke with Dr. Tye via phone, was given verbal permission to give the ordered Gabapentin  and Lithium  PO. Was ordered to pause the NG tube suction for 30 minutes after medication administration.

## 2024-05-23 NOTE — Progress Notes (Signed)
 [facility]- General Surgery  SURGICAL PROGRESS NOTE  Hospital Day(s): 1.   Interval History:  The 8-hour small bowel follow-through x-ray from last night showed contrast in colon. This morning, patient was having generalized abdominal discomfort and was complaining of a sore throat and cough. He was afraid he was not swallowing his meds. later in the morning, patient admits the abdominal discomfort is minimal and is feeling better overall.  Denies passing gas but had a very small bowel movement.  Denies any nausea or vomiting.   Vital signs in last 24 hours: [min-max] current  Temp:  [98 F (36.7 C)-98.5 F (36.9 C)] 98.2 F (36.8 C) (11/24 1045) Pulse Rate:  [52-60] 56 (11/24 1000) Resp:  [17-20] 18 (11/24 1000) BP: (140-158)/(74-91) 154/85 (11/24 1000) SpO2:  [96 %-100 %] 99 % (11/24 1000)     Height: 5' 9 (175.3 cm) Weight: 85.7 kg BMI (Calculated): 27.9   Intake/Output last 2 shifts:  11/23 0701 - 11/24 0700 In: -  Out: 1600 [Urine:1300; Emesis/NG output:300]   Physical Exam:  Constitutional: alert, cooperative and no distress  Respiratory: breathing non-labored at rest  Cardiovascular: regular rate and sinus rhythm  Gastrointestinal: soft, non-tender, and mildly distended  Labs:     Latest Ref Rng & Units 05/23/2024    5:20 AM 05/22/2024    9:34 AM 09/10/2023    9:09 AM  CBC  WBC 4.0 - 10.5 K/uL 5.2  9.0  5.0   Hemoglobin 13.0 - 17.0 g/dL 85.8  84.4  84.6   Hematocrit 39.0 - 52.0 % 42.0  45.0  44.2   Platelets 150 - 400 K/uL 199  241  207       Latest Ref Rng & Units 05/23/2024    5:20 AM 05/22/2024    9:34 AM 09/10/2023    9:09 AM  CMP  Glucose 70 - 99 mg/dL 895  874  888   BUN 8 - 23 mg/dL 9  11  10    Creatinine 0.61 - 1.24 mg/dL 9.20  9.09  8.94   Sodium 135 - 145 mmol/L 142  139  139   Potassium 3.5 - 5.1 mmol/L 4.0  4.0  3.8   Chloride 98 - 111 mmol/L 112  108  104   CO2 22 - 32 mmol/L 21  21  28    Calcium 8.9 - 10.3 mg/dL 8.8  9.1  9.0   Total  Protein 6.5 - 8.1 g/dL  7.2    Total Bilirubin 0.0 - 1.2 mg/dL  0.6    Alkaline Phos 38 - 126 U/L  93    AST 15 - 41 U/L  35    ALT 0 - 44 U/L  25      Imaging studies:  EXAM: 1 VIEW XRAY OF THE ABDOMEN 05/23/2024 02:12:39 AM   COMPARISON: 05/22/2024   CLINICAL HISTORY:   FINDINGS:   BOWEL: 8-hour small bowel follow-through film shows administered contrast to lie within the stomach. Some contrast is noted in mildly dilated loops of small bowel as well as within the colon consistent with a partial small bowel obstruction.   SOFT TISSUES: No free air is seen.   BONES: No acute osseous abnormality.   IMPRESSION: 1. Partial small bowel obstruction.   Electronically signed by: Oneil Devonshire MD 05/23/2024 02:34 AM EST RP Workstation: [id number]  EXAM: 2 VIEW(S) XRAY OF THE CHEST 05/23/2024 09:36:28 AM   COMPARISON: 09/10/2023, CT yesterday.   CLINICAL HISTORY: 63 year old male with cough.  FINDINGS:   LINES, TUBES AND DEVICES: Enteric tube courses to the stomach, and there is a small volume of barium type contrast within the gastric lumen.   LUNGS AND PLEURA: Low lung volumes. Elevated right hemidiaphragm. Prominent bilateral interstitial opacities, probable atelectasis. No pleural effusion. No pneumothorax.   HEART AND MEDIASTINUM: No acute abnormality of the cardiac and mediastinal silhouettes.   BONES AND SOFT TISSUES: Partially visible cervical ACDF hardware. No acute osseous abnormality.   UPPER ABDOMEN (VISIBLE PORTION): Colonic interposition again noted. There is some oral contrast also now in the visible large bowel.   IMPRESSION: 1. Low lung volumes with atelectasis, No other acute cardiopulmonary abnormality identified. 2. Enteric tube in the stomach. Oral contrast visible in both the stomach and colon.   Electronically signed by: Helayne Hurst MD 05/23/2024 09:57 AM EST RP Workstation: [id number]    Assessment/Plan:  63 y.o. male with  small bowel obstruction, complicated by pertinent comorbidities including bipolar disorder, hypertension, hyperlipidemia, and benign prostatic hyperplasia.    - Stable vital signs, no fever and not tachycardic   - No leukocytosis    - Cough and sore throat likely related to NGT, chest x-ray reassuring for no respiratory etiology.   - NG clamp trial, if residual is < 200 cc, plan to discontinue NGT and start clear liquid diet   - Continue IV fluids and pain management   -- Anai Lipson Barrientos PA-C

## 2024-05-23 NOTE — ED Notes (Signed)
 Reports feeling better now that NG is out and eating his clear liquid diet

## 2024-05-23 NOTE — ED Notes (Signed)
 X-ray at bedside.

## 2024-05-24 LAB — CBC
HCT: 44.1 % (ref 39.0–52.0)
Hemoglobin: 14.9 g/dL (ref 13.0–17.0)
MCH: 32.4 pg (ref 26.0–34.0)
MCHC: 33.8 g/dL (ref 30.0–36.0)
MCV: 95.9 fL (ref 80.0–100.0)
Platelets: 230 K/uL (ref 150–400)
RBC: 4.6 MIL/uL (ref 4.22–5.81)
RDW: 13.2 % (ref 11.5–15.5)
WBC: 11 K/uL — ABNORMAL HIGH (ref 4.0–10.5)
nRBC: 0 % (ref 0.0–0.2)

## 2024-05-24 LAB — BASIC METABOLIC PANEL WITH GFR
Anion gap: 12 (ref 5–15)
BUN: 8 mg/dL (ref 8–23)
CO2: 19 mmol/L — ABNORMAL LOW (ref 22–32)
Calcium: 9.1 mg/dL (ref 8.9–10.3)
Chloride: 104 mmol/L (ref 98–111)
Creatinine, Ser: 0.89 mg/dL (ref 0.61–1.24)
GFR, Estimated: >60 mL/min (ref >60)
Glucose, Bld: 105 mg/dL — ABNORMAL HIGH (ref 70–99)
Potassium: 4.1 mmol/L (ref 3.5–5.1)
Sodium: 135 mmol/L (ref 135–145)

## 2024-05-24 NOTE — Plan of Care (Signed)

## 2024-05-24 NOTE — Progress Notes (Signed)
 Pt tolerated medications whole with no aspirations difficulty noted. He is requesting a regular diet.

## 2024-05-24 NOTE — Progress Notes (Signed)
 [facility]- General Surgery  SURGICAL PROGRESS NOTE  Hospital Day(s): 2.   Interval History:  Patient reports feeling great this morning. Has tolerated clear liquids. Denies any abdominal pain, nausea or vomiting. Had multiple bowel movements yesterday.    Vital signs in last 24 hours: [min-max] current  Temp:  [97.7 F (36.5 C)-98.8 F (37.1 C)] 98.8 F (37.1 C) (11/25 0459) Pulse Rate:  [50-61] 54 (11/25 0459) Resp:  [16-20] 20 (11/24 1841) BP: (108-183)/(60-99) 143/78 (11/25 0459) SpO2:  [96 %-99 %] 96 % (11/25 0459)     Height: 5' 9 (175.3 cm) Weight: 85.7 kg BMI (Calculated): 27.9   Intake/Output last 2 shifts:  11/24 0701 - 11/25 0700 In: 1365.3 [I.V.:1305.3; NG/GT:60] Out: 800 [Urine:800]   Physical Exam:  Constitutional: alert, cooperative and no distress  Respiratory: breathing non-labored at rest  Cardiovascular: regular rate and sinus rhythm  Gastrointestinal: soft, non-tender, and mildly distended  Labs:     Latest Ref Rng & Units 05/24/2024    4:06 AM 05/23/2024    5:20 AM 05/22/2024    9:34 AM  CBC  WBC 4.0 - 10.5 K/uL 11.0  5.2  9.0   Hemoglobin 13.0 - 17.0 g/dL 85.0  85.8  84.4   Hematocrit 39.0 - 52.0 % 44.1  42.0  45.0   Platelets 150 - 400 K/uL 230  199  241       Latest Ref Rng & Units 05/24/2024    4:06 AM 05/23/2024    5:20 AM 05/22/2024    9:34 AM  CMP  Glucose 70 - 99 mg/dL 894  895  874   BUN 8 - 23 mg/dL 8  9  11    Creatinine 0.61 - 1.24 mg/dL 9.10  9.20  9.09   Sodium 135 - 145 mmol/L 135  142  139   Potassium 3.5 - 5.1 mmol/L 4.1  4.0  4.0   Chloride 98 - 111 mmol/L 104  112  108   CO2 22 - 32 mmol/L 19  21  21    Calcium 8.9 - 10.3 mg/dL 9.1  8.8  9.1   Total Protein 6.5 - 8.1 g/dL   7.2   Total Bilirubin 0.0 - 1.2 mg/dL   0.6   Alkaline Phos 38 - 126 U/L   93   AST 15 - 41 U/L   35   ALT 0 - 44 U/L   25     Imaging studies: No new pertinent imaging studies   Assessment/Plan:  63 y.o. male with small bowel  obstruction, complicated by pertinent comorbidities including bipolar disorder, hypertension, hyperlipidemia, and benign prostatic hyperplasia.    - Stable vital signs, no fever not tachycardic  - Leukocytosis increased 5.2 >> 11.0, but patient is asymptomatic  - Clinical presentation improving, no abdominal discomfort on exam  - Has tolerated clear liquids, advance to full  - Continue ambulate  - Continue pain management  -- Azaryah Heathcock Barrientos PA-C

## 2024-05-24 NOTE — Plan of Care (Signed)
  Problem: Health Behavior/Discharge Planning: Goal: Ability to manage health-related needs will improve Outcome: Progressing   Problem: Activity: Goal: Risk for activity intolerance will decrease Outcome: Progressing   Problem: Coping: Goal: Level of anxiety will decrease Outcome: Progressing   Problem: Pain Managment: Goal: General experience of comfort will improve and/or be controlled Outcome: Progressing

## 2024-05-25 LAB — BASIC METABOLIC PANEL WITH GFR
Anion gap: 10 (ref 5–15)
BUN: 11 mg/dL (ref 8–23)
CO2: 22 mmol/L (ref 22–32)
Calcium: 9.3 mg/dL (ref 8.9–10.3)
Chloride: 105 mmol/L (ref 98–111)
Creatinine, Ser: 0.91 mg/dL (ref 0.61–1.24)
GFR, Estimated: >60 mL/min (ref >60)
Glucose, Bld: 102 mg/dL — ABNORMAL HIGH (ref 70–99)
Potassium: 4.1 mmol/L (ref 3.5–5.1)
Sodium: 138 mmol/L (ref 135–145)

## 2024-05-25 LAB — CBC
HCT: 45 % (ref 39.0–52.0)
Hemoglobin: 15.4 g/dL (ref 13.0–17.0)
MCH: 32.8 pg (ref 26.0–34.0)
MCHC: 34.2 g/dL (ref 30.0–36.0)
MCV: 95.7 fL (ref 80.0–100.0)
Platelets: 245 K/uL (ref 150–400)
RBC: 4.7 MIL/uL (ref 4.22–5.81)
RDW: 13.2 % (ref 11.5–15.5)
WBC: 8.2 K/uL (ref 4.0–10.5)
nRBC: 0 % (ref 0.0–0.2)

## 2024-05-25 MED ORDER — PANTOPRAZOLE SODIUM 40 MG PO TBEC: 40.0000 mg | DELAYED_RELEASE_TABLET | Freq: Every day | ORAL | Status: DC

## 2024-05-25 NOTE — Progress Notes (Signed)
 Patient is alert and oriented X 4. Discharge instruction given. No any questions at this time. Peripheral I/v removed.

## 2024-05-25 NOTE — Progress Notes (Signed)
 PHARMACIST - PHYSICIAN COMMUNICATION CONCERNING: IV to Oral Route Change Policy  RECOMMENDATION: This patient is receiving pantoprazole  intravenously. Based on criteria approved by the Pharmacy and Therapeutics Committee, the intravenous medication(s) is/are being converted to the equivalent dose of an oral formulation.   DESCRIPTION: These criteria include: The patient is eating (either orally or via tube) and/or has been taking other orally administered medications for at least 24 hours. The patient has no evidence of active gastrointestinal bleeding or malabsorptive GI state (gastrectomy; short bowel; patient on TNA or NPO).  If you have questions about this conversion, please contact the Pharmacy Department:  [x]   [phone] )  [facility] []   [phone] )  [facility] []   [phone] )  [facility] []   [phone] )  [facility]   Will M. Lenon, PharmD, BCPS Clinical Pharmacist 05/25/2024 1:40 PM

## 2024-05-25 NOTE — Plan of Care (Signed)
  Problem: Coping: Goal: Level of anxiety will decrease Outcome: Progressing   Problem: Pain Managment: Goal: General experience of comfort will improve and/or be controlled Outcome: Progressing   Problem: Safety: Goal: Ability to remain free from injury will improve Outcome: Progressing   Problem: Skin Integrity: Goal: Risk for impaired skin integrity will decrease Outcome: Progressing

## 2024-05-25 NOTE — Progress Notes (Signed)
 TOC Brief Note Patient presented from home with SBO being treated medically. Diet being advanced from clears to full liquids today. No TOC needs identified.

## 2024-05-25 NOTE — Plan of Care (Signed)

## 2024-05-25 NOTE — Discharge Instructions (Signed)
  Diet: Resume home heart healthy regular diet.   Call office [phone]) at any time if any questions, worsening pain, fevers/chills, or other concerns.

## 2024-05-25 NOTE — Discharge Summary (Signed)
 [facility]-General Surgery  SURGICAL DISCHARGE SUMMARY  Patient ID: Andrew Mcclure MRN: [id number] DOB/AGE: 10-02-1960 63 y.o.  Admit date: 05/22/2024 Discharge date: 05/25/2024  Discharge Diagnoses Patient Active Problem List   Diagnosis Date Noted   SBO (small bowel obstruction) (HCC) 05/22/2024   Chest pain 09/10/2023   Sinus bradycardia 09/10/2023   Prolonged QT interval 05/18/2022   COVID-19 virus infection 05/17/2022   Dehydration 05/17/2022   Bipolar disorder (HCC) 05/17/2022   BPH (benign prostatic hyperplasia) 05/17/2022    Consultants None   Procedures None    Hospital Course:  Patient presented to the [facility] ED on 05/22/2024 with abdominal discomfort and bloating x 2 days. In the ED, patient was overall stable with no signs of acute abdomen. No leukocytosis shown in labs. LFTs, total bilirubin, and lipase levels were all within normal range. UA was negative for UTI. CT of abdomen showed dilation of the proximal and mid small bowel loops with air-fluids levels measuring up to 4.2 cm with a transition to decompressed distal small bowel.  NGT tube was placed and proceeded with a small bowel follow-through. The abdominal x-ray showed some contrast in small bowel as well as within the colon, findings consistent with partial small bowel obstruction.  Patient started having return of GI function on 05/23/2024. NG residual checked with no output, therefore, NGT was removed and patient was started on clear liquids. Diet was slowly advanced to regular diet. Earlier that morning patient was complaining of a new-onset cough and throat discomfort. Chest x-ray was obtained with no acute findings. Throat discomfort was likely related to NGT. Patient had an increase in leukocytosis with chills but no fever or other new symptoms. WBC normalized the following day.  Patient is now tolerating regular diet and reports passing gas. Denies any nausea, vomiting or abdominal discomfort. Admits  feeling well. No other complaints.    Physical Examination:  Constitutional: alert, in no acute distress Pulmonary: CTA bilaterally, normal breath sounds Cardiac: regular rate and rhythm Gastrointestinal: soft, non-tender, and non-distended   Allergies as of 05/25/2024   No Known Allergies      Medication List     TAKE these medications    amLODipine  5 MG tablet Commonly known as: NORVASC  Take 5 mg by mouth daily.   buPROPion  150 MG 24 hr tablet Commonly known as: WELLBUTRIN  XL Take 150 mg by mouth daily.   clonazePAM  0.5 MG tablet Commonly known as: KLONOPIN  Take 0.5 mg by mouth 2 (two) times daily as needed for anxiety.   finasteride  5 MG tablet Commonly known as: PROSCAR  Take 5 mg by mouth daily.   gabapentin  300 MG capsule Commonly known as: NEURONTIN  Take 300 mg by mouth at bedtime.   lithium  carbonate 300 MG ER tablet Commonly known as: LITHOBID  Take 300 mg by mouth 2 (two) times daily.   nitroGLYCERIN 0.4 MG SL tablet Commonly known as: NITROSTAT Place 0.4 mg under the tongue every 5 (five) minutes as needed for chest pain.   pantoprazole  40 MG tablet Commonly known as: PROTONIX  Take 40 mg by mouth daily.   rosuvastatin 10 MG tablet Commonly known as: CRESTOR Take 10 mg by mouth daily.          Follow-up Information     Doria Proud, MD Follow up.   Specialty: Internal Medicine Why: hospital follow up Contact information: [address] [place] [address] [phone]  Time spent on discharge management including discussion of hospital course, clinical condition, outpatient instructions, prescriptions, and follow up with the patient and members of the medical team: >30 minutes  Helmut Hennon Barrientos PA-C
